# Patient Record
Sex: Male | Born: 1940 | Race: Black or African American | Hispanic: No | Smoking: Former smoker
Health system: Southern US, Community
[De-identification: ages and names within clinical notes are randomized; demographics above are authoritative.]

## PROBLEM LIST (undated history)

## (undated) DIAGNOSIS — I10 Essential (primary) hypertension: Secondary | ICD-10-CM

## (undated) DIAGNOSIS — E119 Type 2 diabetes mellitus without complications: Secondary | ICD-10-CM

## (undated) DIAGNOSIS — C61 Malignant neoplasm of prostate: Secondary | ICD-10-CM

## (undated) HISTORY — PX: PROSTATE SURGERY: SHX751

---

## 2004-02-10 ENCOUNTER — Emergency Department: Payer: Self-pay | Admitting: General Practice

## 2006-01-31 ENCOUNTER — Other Ambulatory Visit: Payer: Self-pay

## 2006-01-31 ENCOUNTER — Emergency Department: Payer: Self-pay | Admitting: Emergency Medicine

## 2007-03-26 ENCOUNTER — Emergency Department: Payer: Self-pay | Admitting: Emergency Medicine

## 2007-09-04 ENCOUNTER — Emergency Department: Payer: Self-pay | Admitting: Emergency Medicine

## 2008-02-09 ENCOUNTER — Emergency Department: Payer: Self-pay | Admitting: Emergency Medicine

## 2008-12-02 ENCOUNTER — Emergency Department: Payer: Self-pay | Admitting: Internal Medicine

## 2009-02-16 ENCOUNTER — Emergency Department: Payer: Self-pay | Admitting: Internal Medicine

## 2009-03-05 ENCOUNTER — Encounter: Payer: Self-pay | Admitting: Internal Medicine

## 2009-03-21 ENCOUNTER — Encounter: Payer: Self-pay | Admitting: Internal Medicine

## 2009-04-21 ENCOUNTER — Encounter: Payer: Self-pay | Admitting: Internal Medicine

## 2015-07-24 ENCOUNTER — Encounter: Payer: Self-pay | Admitting: Emergency Medicine

## 2015-07-24 ENCOUNTER — Emergency Department
Admission: EM | Admit: 2015-07-24 | Discharge: 2015-07-24 | Disposition: A | Discharge status: Home / Self Care | Payer: No Typology Code available for payment source | Attending: Emergency Medicine | Admitting: Emergency Medicine

## 2015-07-24 ENCOUNTER — Emergency Department: Payer: No Typology Code available for payment source

## 2015-07-24 DIAGNOSIS — Y939 Activity, unspecified: Secondary | ICD-10-CM | POA: Insufficient documentation

## 2015-07-24 DIAGNOSIS — Y929 Unspecified place or not applicable: Secondary | ICD-10-CM | POA: Insufficient documentation

## 2015-07-24 DIAGNOSIS — Y999 Unspecified external cause status: Secondary | ICD-10-CM | POA: Insufficient documentation

## 2015-07-24 DIAGNOSIS — E119 Type 2 diabetes mellitus without complications: Secondary | ICD-10-CM | POA: Insufficient documentation

## 2015-07-24 DIAGNOSIS — I10 Essential (primary) hypertension: Secondary | ICD-10-CM | POA: Diagnosis not present

## 2015-07-24 DIAGNOSIS — F17200 Nicotine dependence, unspecified, uncomplicated: Secondary | ICD-10-CM | POA: Insufficient documentation

## 2015-07-24 DIAGNOSIS — S161XXA Strain of muscle, fascia and tendon at neck level, initial encounter: Secondary | ICD-10-CM | POA: Diagnosis not present

## 2015-07-24 DIAGNOSIS — S199XXA Unspecified injury of neck, initial encounter: Secondary | ICD-10-CM | POA: Diagnosis present

## 2015-07-24 HISTORY — DX: Type 2 diabetes mellitus without complications: E11.9

## 2015-07-24 HISTORY — DX: Essential (primary) hypertension: I10

## 2015-07-24 MED ORDER — TRAMADOL HCL 50 MG PO TABS: 50.0000 mg | ORAL_TABLET | Freq: Two times a day (BID) | ORAL | Status: DC | PRN
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
## 2015-07-24 NOTE — Discharge Instructions (Signed)

## 2015-12-14 ENCOUNTER — Emergency Department: Payer: Medicare HMO

## 2015-12-14 ENCOUNTER — Encounter: Payer: Self-pay | Admitting: Emergency Medicine

## 2015-12-14 ENCOUNTER — Emergency Department
Admission: EM | Admit: 2015-12-14 | Discharge: 2015-12-14 | Disposition: A | Discharge status: Home / Self Care | Payer: Medicare HMO | Attending: Emergency Medicine | Admitting: Emergency Medicine

## 2015-12-14 DIAGNOSIS — E119 Type 2 diabetes mellitus without complications: Secondary | ICD-10-CM | POA: Insufficient documentation

## 2015-12-14 DIAGNOSIS — M79605 Pain in left leg: Secondary | ICD-10-CM | POA: Diagnosis present

## 2015-12-14 DIAGNOSIS — Z8546 Personal history of malignant neoplasm of prostate: Secondary | ICD-10-CM | POA: Diagnosis not present

## 2015-12-14 DIAGNOSIS — I82412 Acute embolism and thrombosis of left femoral vein: Secondary | ICD-10-CM | POA: Diagnosis not present

## 2015-12-14 DIAGNOSIS — Z794 Long term (current) use of insulin: Secondary | ICD-10-CM | POA: Insufficient documentation

## 2015-12-14 DIAGNOSIS — F17210 Nicotine dependence, cigarettes, uncomplicated: Secondary | ICD-10-CM | POA: Insufficient documentation

## 2015-12-14 DIAGNOSIS — O2230 Deep phlebothrombosis in pregnancy, unspecified trimester: Secondary | ICD-10-CM

## 2015-12-14 DIAGNOSIS — Z791 Long term (current) use of non-steroidal anti-inflammatories (NSAID): Secondary | ICD-10-CM | POA: Diagnosis not present

## 2015-12-14 DIAGNOSIS — I10 Essential (primary) hypertension: Secondary | ICD-10-CM | POA: Insufficient documentation

## 2015-12-14 HISTORY — DX: Malignant neoplasm of prostate: C61

## 2015-12-14 LAB — CBC
HCT: 38.1 % — ABNORMAL LOW (ref 40.0–52.0)
HEMOGLOBIN: 13.5 g/dL (ref 13.0–18.0)
MCH: 29.4 pg (ref 26.0–34.0)
MCHC: 35.5 g/dL (ref 32.0–36.0)
MCV: 82.9 fL (ref 80.0–100.0)
Platelets: 242 10*3/uL (ref 150–440)
RBC: 4.59 MIL/uL (ref 4.40–5.90)
RDW: 14.8 % — AB (ref 11.5–14.5)
WBC: 6.5 10*3/uL (ref 3.8–10.6)

## 2015-12-14 LAB — BASIC METABOLIC PANEL
ANION GAP: 7 (ref 5–15)
BUN: 24 mg/dL — AB (ref 6–20)
CALCIUM: 8.8 mg/dL — AB (ref 8.9–10.3)
CO2: 23 mmol/L (ref 22–32)
Chloride: 108 mmol/L (ref 101–111)
Creatinine, Ser: 1.22 mg/dL (ref 0.61–1.24)
GFR calc Af Amer: >60 mL/min (ref >60)
GFR, EST NON AFRICAN AMERICAN: 57 mL/min — AB (ref >60)
GLUCOSE: 189 mg/dL — AB (ref 65–99)
Potassium: 3.7 mmol/L (ref 3.5–5.1)
Sodium: 138 mmol/L (ref 135–145)

## 2015-12-14 MED ORDER — RIVAROXABAN (XARELTO) VTE STARTER PACK (15 & 20 MG): ORAL_TABLET | ORAL | 0 refills | Status: DC

## 2015-12-14 NOTE — ED Notes (Signed)
Patient states that he has been having swelling in his left leg that started this week. Last week patient noted that he was having swelling in his ankle but it would go down at night Patient also states that he is having pain in his calf.  Patient denies recent surgery or travel, patient has not had any injury to the area. Patient denies chest pain or shortness of breath.

## 2015-12-14 NOTE — ED Provider Notes (Signed)
[facility] Emergency Department Provider Note    ____________________________________________   I have reviewed the triage vital signs and the nursing notes.   HISTORY  Chief Complaint Leg Pain   History limited by: Not Limited   HPI Ricky Brennan is a 75 y.o. male who presents to the emergency department today because of concerns for left leg pain and swelling. The patient started having swelling 1 week ago. It started in his ankle and started coming up his leg. He started developing pain a couple of days ago. He describes it as a soreness. It is present when he walks but states he is able to walk.He denies any chest pain or shortness breath. Denies any recent travel. No history of blood clots. No fevers.    Past Medical History:  Diagnosis Date  . Diabetes mellitus without complication ([place])   . Hypertension   . Prostate cancer ([place])     There are no active problems to display for this patient.   History reviewed. No pertinent surgical history.  Prior to Admission medications   Medication Sig Start Date End Date Taking? Authorizing Provider  enalapril (VASOTEC) 10 MG tablet Take 10 mg by mouth daily.    Historical Provider, MD  insulin aspart protamine- aspart (NOVOLOG MIX 70/30) (70-30) 100 UNIT/ML injection Inject 30 Units into the skin 2 (two) times daily with a meal.    Historical Provider, MD  traMADol (ULTRAM) 50 MG tablet Take 1 tablet (50 mg total) by mouth every 12 (twelve) hours as needed for moderate pain. 07/24/15   Sable Feil, PA-C    Allergies Review of patient's allergies indicates no known allergies.  History reviewed. No pertinent family history.  Social History Social History  Substance Use Topics  . Smoking status: Current Some Day Smoker    Types: Cigars  . Smokeless tobacco: Never Used  . Alcohol use Yes    Review of Systems  Constitutional: Negative for fever. Cardiovascular: Negative for chest  pain. Respiratory: Negative for shortness of breath. Gastrointestinal: Negative for abdominal pain, vomiting and diarrhea. Genitourinary: Negative for dysuria. Musculoskeletal: Negative for back pain. Positive for left leg. Skin: Negative for rash. Neurological: Negative for headaches, focal weakness or numbness.   10-point ROS otherwise negative.  ____________________________________________   PHYSICAL EXAM:  VITAL SIGNS: ED Triage Vitals [12/14/15 0933]  Enc Vitals Group     BP (!) 152/82     Pulse Rate 74     Resp 20     Temp 98 F (36.7 C)     Temp Source Oral     SpO2 96 %     Weight 215 lb (97.5 kg)     Height 5\' 11"  (1.803 m)     Head Circumference      Peak Flow      Pain Score 5   Constitutional: Alert and oriented. Well appearing and in no distress. Eyes: Conjunctivae are normal. PERRL. Normal extraocular movements. ENT   Head: Normocephalic and atraumatic.   Nose: No congestion/rhinnorhea.   Mouth/Throat: Mucous membranes are moist.   Neck: No stridor. Hematological/Lymphatic/Immunilogical: No cervical lymphadenopathy. Cardiovascular: Normal rate, regular rhythm.  No murmurs, rubs, or gallops. Respiratory: Normal respiratory effort without tachypnea nor retractions. Breath sounds are clear and equal bilaterally. No wheezes/rales/rhonchi. Gastrointestinal: Soft and nontender. No distention. Genitourinary: Deferred Musculoskeletal: Left leg swelling. Dorsalis pedis pulse 2+. Neurologic:  Normal speech and language. No gross focal neurologic deficits are appreciated.  Skin:  Skin is warm,  dry and intact. No rash noted. Psychiatric: Mood and affect are normal. Speech and behavior are normal. Patient exhibits appropriate insight and judgment.  ____________________________________________    LABS (pertinent positives/negatives)  Labs Reviewed  BASIC METABOLIC PANEL - Abnormal; Notable for the following:       Result Value   Glucose, Bld 189  (*)    BUN 24 (*)    Calcium 8.8 (*)    GFR calc non Af Amer 57 (*)    All other components within normal limits  CBC - Abnormal; Notable for the following:    HCT 38.1 (*)    RDW 14.8 (*)    All other components within normal limits     ____________________________________________   EKG  None  ____________________________________________    RADIOLOGY  US venous IMPRESSION:  Sonographic survey of the left lower extremity is positive for DVT  with occlusive thrombus of the left femoral vein, extending distally  through the popliteal vein and into the tibial veins.      ____________________________________________   PROCEDURES  Procedures  ____________________________________________   INITIAL IMPRESSION / ASSESSMENT AND PLAN / ED COURSE  Pertinent labs & imaging results that were available during my care of the patient were reviewed by me and considered in my medical decision making (see chart for details).  Patient presents with left leg swelling and pain. Ultrasound is consistent with DVT. Will start patient on Xarelto. 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 Any transcriptional errors that result from this process are unintentional    Nance Pear, MD 12/14/15 1245

## 2015-12-14 NOTE — Discharge Instructions (Signed)
Please seek medical attention for any high fevers, chest pain, shortness of breath, change in behavior, persistent vomiting, bloody stool or any other new or concerning symptoms.  

## 2015-12-14 NOTE — ED Triage Notes (Signed)
Patient presents to the ED with swelling to his left leg.  Patient is in no obvious distress at this time.  Patient reports history of hypertension.  Patient denies pain but reports some pitting edema.  Denies swelling to right leg.  Patient ambulatory to triage with no obvious distress at this time.

## 2015-12-14 NOTE — ED Triage Notes (Signed)
Pt denies any chest pain or shortness of breath.

## 2015-12-14 NOTE — ED Triage Notes (Signed)
Pt presents to ED via POV with son for c/o left leg swelling and calf pain.  Pt states last week he noticed his leg swelling but went away overnight, but has returned.  Pt states he noticed ankle pain to his ankle which moved up into his calf. Calf pain started about 2 days ago.  Right leg does not have any swelling.  3+ edema on left leg.  Pain worse with ambulation.

## 2016-04-26 ENCOUNTER — Encounter: Payer: Self-pay | Admitting: Emergency Medicine

## 2016-04-26 ENCOUNTER — Emergency Department: Payer: Medicare HMO

## 2016-04-26 ENCOUNTER — Emergency Department
Admission: EM | Admit: 2016-04-26 | Discharge: 2016-04-26 | Disposition: A | Discharge status: Home / Self Care | Payer: Medicare HMO | Attending: Emergency Medicine | Admitting: Emergency Medicine

## 2016-04-26 DIAGNOSIS — E119 Type 2 diabetes mellitus without complications: Secondary | ICD-10-CM | POA: Diagnosis not present

## 2016-04-26 DIAGNOSIS — J069 Acute upper respiratory infection, unspecified: Secondary | ICD-10-CM | POA: Diagnosis not present

## 2016-04-26 DIAGNOSIS — I10 Essential (primary) hypertension: Secondary | ICD-10-CM | POA: Insufficient documentation

## 2016-04-26 DIAGNOSIS — F17290 Nicotine dependence, other tobacco product, uncomplicated: Secondary | ICD-10-CM | POA: Insufficient documentation

## 2016-04-26 DIAGNOSIS — Z8546 Personal history of malignant neoplasm of prostate: Secondary | ICD-10-CM | POA: Insufficient documentation

## 2016-04-26 DIAGNOSIS — Z79899 Other long term (current) drug therapy: Secondary | ICD-10-CM | POA: Diagnosis not present

## 2016-04-26 DIAGNOSIS — R05 Cough: Secondary | ICD-10-CM | POA: Diagnosis present

## 2016-04-26 DIAGNOSIS — Z794 Long term (current) use of insulin: Secondary | ICD-10-CM | POA: Insufficient documentation

## 2016-04-26 MED ORDER — AZITHROMYCIN 250 MG PO TABS: ORAL_TABLET | ORAL | 0 refills | Status: AC

## 2016-04-26 MED ORDER — PSEUDOEPH-BROMPHEN-DM 30-2-10 MG/5ML PO SYRP: 5.0000 mL | ORAL_SOLUTION | Freq: Four times a day (QID) | ORAL | 0 refills | Status: DC | PRN

## 2016-04-26 MED ORDER — HYDROCOD POLST-CPM POLST ER 10-8 MG/5ML PO SUER
5.0000 mL | Freq: Once | ORAL | Status: AC
  Administered 2016-04-26: 5 mL via ORAL
  Filled 2016-04-26: qty 5

## 2016-04-26 NOTE — ED Triage Notes (Signed)
Cough x 4 days

## 2016-04-26 NOTE — ED Provider Notes (Signed)
[facility] Emergency Department Provider Note   ____________________________________________   First MD Initiated Contact with Patient 04/26/16 1116     (approximate)  I have reviewed the triage vital signs and the nursing notes.   HISTORY  Chief Complaint Cough    HPI Ricky Brennan is a 76 y.o. male patient complaining of productive greenish cough 4 days. Patient denies any fever associated this complaint. Patient denies nausea, vomiting, or diarrhea. Patient is taking flu shot this season. Patient denies any pain with this complaint.   Past Medical History:  Diagnosis Date  . Diabetes mellitus without complication ([place])   . Hypertension   . Prostate cancer ([place])     There are no active problems to display for this patient.   History reviewed. No pertinent surgical history.  Prior to Admission medications   Medication Sig Start Date End Date Taking? Authorizing Provider  azithromycin (ZITHROMAX Z-PAK) 250 MG tablet Take 2 tablets (500 mg) on  Day 1,  followed by 1 tablet (250 mg) once daily on Days 2 through 5. 04/26/16 05/01/16  Sable Feil, PA-C  brompheniramine-pseudoephedrine-DM 30-2-10 MG/5ML syrup Take 5 mLs by mouth 4 (four) times daily as needed. 04/26/16   Sable Feil, PA-C  enalapril (VASOTEC) 10 MG tablet Take 10 mg by mouth daily.    Historical Provider, MD  insulin aspart protamine- aspart (NOVOLOG MIX 70/30) (70-30) 100 UNIT/ML injection Inject 30 Units into the skin 2 (two) times daily with a meal.    Historical Provider, MD  Rivaroxaban (XARELTO STARTER PACK) 15 & 20 MG TBPK Take as directed on package: Start with one 15mg  tablet by mouth twice a day with food. On Day 22, switch to one 20mg  tablet once a day with food. 12/14/15   Nance Pear, MD  traMADol (ULTRAM) 50 MG tablet Take 1 tablet (50 mg total) by mouth every 12 (twelve) hours as needed for moderate pain. 07/24/15   Sable Feil, PA-C    Allergies Patient has  no known allergies.  No family history on file.  Social History Social History  Substance Use Topics  . Smoking status: Current Some Day Smoker    Types: Cigars  . Smokeless tobacco: Never Used  . Alcohol use Yes    Review of Systems Constitutional: No fever/chills Eyes: No visual changes. ENT: No sore throat. Cardiovascular: Denies chest pain. Respiratory: Denies shortness of breath. Dr. cough Gastrointestinal: No abdominal pain.  No nausea, no vomiting.  No diarrhea.  No constipation. Genitourinary: Negative for dysuria. Musculoskeletal: Negative for back pain. Skin: Negative for rash. Neurological: Negative for headaches, focal weakness or numbness. Endocrine:Hypertension and diabetes   ____________________________________________   PHYSICAL EXAM:  VITAL SIGNS: ED Triage Vitals  Enc Vitals Group     BP 04/26/16 1020 (!) 160/67     Pulse Rate 04/26/16 1020 92     Resp 04/26/16 1020 20     Temp 04/26/16 1020 98.2 F (36.8 C)     Temp Source 04/26/16 1020 Oral     SpO2 04/26/16 1020 95 %     Weight 04/26/16 1020 220 lb (99.8 kg)     Height 04/26/16 1020 5\' 11"  (1.803 m)     Head Circumference --      Peak Flow --      Pain Score 04/26/16 1113 0     Pain Loc --      Pain Edu? --      Excl. in [place]? --  Constitutional: Alert and oriented. Well appearing and in no acute distress. Eyes: Conjunctivae are normal. PERRL. EOMI. Head: Atraumatic. Nose: No congestion/rhinnorhea. Mouth/Throat: Mucous membranes are moist.  Oropharynx non-erythematous. Neck: No stridor.  No cervical spine tenderness to palpation. Hematological/Lymphatic/Immunilogical: No cervical lymphadenopathy. Cardiovascular: Normal rate, regular rhythm. Grossly normal heart sounds.  Good peripheral circulation. Respiratory: Normal respiratory effort.  No retractions. Lungs CTAB. Gastrointestinal: Soft and nontender. No distention. No abdominal bruits. No CVA tenderness. Musculoskeletal: No lower  extremity tenderness nor edema.  No joint effusions. Neurologic:  Normal speech and language. No gross focal neurologic deficits are appreciated. No gait instability. Skin:  Skin is warm, dry and intact. No rash noted. Psychiatric: Mood and affect are normal. Speech and behavior are normal.  ____________________________________________   LABS (all labs ordered are listed, but only abnormal results are displayed)  Labs Reviewed - No data to display ____________________________________________  EKG   ____________________________________________  RADIOLOGY  No acute findings on x-ray of chest. ____________________________________________   PROCEDURES  Procedure(s) performed: None  Procedures  Critical Care performed: No  ____________________________________________   INITIAL IMPRESSION / ASSESSMENT AND PLAN / ED COURSE  Pertinent labs & imaging results that were available during my care of the patient were reviewed by me and considered in my medical decision making (see chart for details).  Bronchitis. Patient given discharge care instructions. Patient given a prescription for Zithromax and Bromfed DM. Patient advised to follow-up with family doctor condition persists. Clinical Course      ____________________________________________   FINAL CLINICAL IMPRESSION(S) / ED DIAGNOSES  Final diagnoses:  Acute upper respiratory infection      NEW MEDICATIONS STARTED DURING THIS VISIT:  Discharge Medication List as of 04/26/2016 11:20 AM    START taking these medications   Details  azithromycin (ZITHROMAX Z-PAK) 250 MG tablet Take 2 tablets (500 mg) on  Day 1,  followed by 1 tablet (250 mg) once daily on Days 2 through 5., Print    brompheniramine-pseudoephedrine-DM 30-2-10 MG/5ML syrup Take 5 mLs by mouth 4 (four) times daily as needed., Starting Sat 04/26/2016, Print         Note:  This document was prepared using Dragon voice recognition software and may  include unintentional dictation errors.    Sable Feil, PA-C 04/26/16 [place], MD 04/26/16 1210

## 2017-01-09 ENCOUNTER — Encounter: Payer: Self-pay | Admitting: Emergency Medicine

## 2017-01-09 ENCOUNTER — Emergency Department
Admission: EM | Admit: 2017-01-09 | Discharge: 2017-01-09 | Disposition: A | Discharge status: Home / Self Care | Payer: Medicare HMO | Attending: Emergency Medicine | Admitting: Emergency Medicine

## 2017-01-09 DIAGNOSIS — F17290 Nicotine dependence, other tobacco product, uncomplicated: Secondary | ICD-10-CM | POA: Diagnosis not present

## 2017-01-09 DIAGNOSIS — B029 Zoster without complications: Secondary | ICD-10-CM | POA: Diagnosis not present

## 2017-01-09 DIAGNOSIS — R21 Rash and other nonspecific skin eruption: Secondary | ICD-10-CM | POA: Diagnosis present

## 2017-01-09 DIAGNOSIS — I10 Essential (primary) hypertension: Secondary | ICD-10-CM | POA: Insufficient documentation

## 2017-01-09 DIAGNOSIS — E119 Type 2 diabetes mellitus without complications: Secondary | ICD-10-CM | POA: Insufficient documentation

## 2017-01-09 DIAGNOSIS — Z79899 Other long term (current) drug therapy: Secondary | ICD-10-CM | POA: Insufficient documentation

## 2017-01-09 MED ORDER — VALACYCLOVIR HCL 500 MG PO TABS
1000.0000 mg | ORAL_TABLET | ORAL | Status: AC
  Administered 2017-01-09: 1000 mg via ORAL
  Filled 2017-01-09: qty 2

## 2017-01-09 MED ORDER — VALACYCLOVIR HCL 1 G PO TABS: 1000.0000 mg | ORAL_TABLET | Freq: Three times a day (TID) | ORAL | 0 refills | Status: AC

## 2017-01-09 NOTE — ED Provider Notes (Signed)
[facility] Emergency Department Provider Note  ____________________________________________  Time seen: Approximately 1:12 PM  I have reviewed the triage vital signs and the nursing notes.   HISTORY  Chief Complaint Herpes Zoster    HPI Ricky Brennan is a 76 y.o. male who complains of pain to the right upper abdomen wrapping around to his back, started 3 days ago, associated with a blistering rash in the area. Denies fever chills no neck pain or stiffness or headaches. No dizziness or syncope. No eye pain. No vision changes. He is diabetic, takes his insulin reliably. Fingerstick this morning was 1:30.Denies HIV, chemotherapy, or any other immunosuppression.  Pain is sharp and burning, moderate intensity, no aggravating or alleviating factors. Patient has had chickenpox as a child. He is not around any small children. His youngest grandchild's 18yrs.   Past Medical History:  Diagnosis Date  . Diabetes mellitus without complication ([place])   . Hypertension   . Prostate cancer ([place])      There are no active problems to display for this patient.    History reviewed. No pertinent surgical history.   Prior to Admission medications   Medication Sig Start Date End Date Taking? Authorizing Provider  brompheniramine-pseudoephedrine-DM 30-2-10 MG/5ML syrup Take 5 mLs by mouth 4 (four) times daily as needed. 04/26/16   Sable Feil, PA-C  enalapril (VASOTEC) 10 MG tablet Take 10 mg by mouth daily.    [provider]  insulin aspart protamine- aspart (NOVOLOG MIX 70/30) (70-30) 100 UNIT/ML injection Inject 30 Units into the skin 2 (two) times daily with a meal.    [provider]  Rivaroxaban (XARELTO STARTER PACK) 15 & 20 MG TBPK Take as directed on package: Start with one 15mg  tablet by mouth twice a day with food. On Day 22, switch to one 20mg  tablet once a day with food. 12/14/15   Nance Pear, MD  traMADol (ULTRAM) 50 MG tablet Take  1 tablet (50 mg total) by mouth every 12 (twelve) hours as needed for moderate pain. 07/24/15   Sable Feil, PA-C  valACYclovir (VALTREX) 1000 MG tablet Take 1 tablet (1,000 mg total) by mouth 3 (three) times daily. 01/09/17 01/16/17  Carrie Mew, MD     Allergies Patient has no known allergies.   No family history on file.  Social History Social History  Substance Use Topics  . Smoking status: Current Some Day Smoker    Types: Cigars  . Smokeless tobacco: Never Used  . Alcohol use Yes    Review of Systems  Constitutional:   No fever or chills.  ENT:   No sore throat. No rhinorrhea. Cardiovascular:   No chest pain or syncope. Respiratory:   No dyspnea or cough. Gastrointestinal:   Positive as above for abdominal pain, no vomiting or diarrhea  Musculoskeletal:   Negative for focal pain or swelling All other systems reviewed and are negative except as documented above in ROS and HPI.  ____________________________________________   PHYSICAL EXAM:  VITAL SIGNS: ED Triage Vitals  Enc Vitals Group     BP --      Pulse Rate 01/09/17 1233 99     Resp 01/09/17 1233 16     Temp 01/09/17 1233 98.4 F (36.9 C)     Temp Source 01/09/17 1233 Oral     SpO2 01/09/17 1233 100 %     Weight 01/09/17 1234 218 lb (98.9 kg)     Height 01/09/17 1234 5\' 11"  (1.803 m)  Head Circumference --      Peak Flow --      Pain Score 01/09/17 1233 10     Pain Loc --      Pain Edu? --      Excl. in [place]? --     Vital signs reviewed, nursing assessments reviewed.   Constitutional:   Alert and oriented. Well appearing and in no distress. Eyes:   No scleral icterus.  EOMI. No nystagmus. No conjunctival pallor. PERRL. ENT   Head:   Normocephalic and atraumatic.   Nose:   No congestion/rhinnorhea.    Mouth/Throat:   MMM, no pharyngeal erythema. No peritonsillar mass.    Neck:   No meningismus. Full ROM Hematological/Lymphatic/Immunilogical:   No cervical  lymphadenopathy. Cardiovascular:   RRR. Symmetric bilateral radial and DP pulses.  No murmurs.  Respiratory:   Normal respiratory effort without tachypnea/retractions. Breath sounds are clear and equal bilaterally. No wheezes/rales/rhonchi. Gastrointestinal:   Soft and nontender. Non distended. There is no CVA tenderness.  No rebound, rigidity, or guarding. Genitourinary:   deferred Musculoskeletal:   Normal range of motion in all extremities. No joint effusions.  No lower extremity tenderness.  No edema. Neurologic:   Normal speech and language.  Motor grossly intact. No gross focal neurologic deficits are appreciated.  Skin:    Skin is warm With crops of erythema and blistering rash in the right T9 distribution..  ____________________________________________    LABS (pertinent positives/negatives) (all labs ordered are listed, but only abnormal results are displayed) Labs Reviewed - No data to display ____________________________________________   EKG    ____________________________________________    RADIOLOGY  No results found.  ____________________________________________   PROCEDURES Procedures  ____________________________________________   INITIAL IMPRESSION / ASSESSMENT AND PLAN / ED COURSE  Pertinent labs & imaging results that were available during my care of the patient were reviewed by me and considered in my medical decision making (see chart for details).  Patient presents with blistering rash on the abdomen consistent with shingles. It is not consistent with Stevens-Johnson syndrome or vasculitis. Not an allergic rash in. Patient given Valtrex in the ED, we'll continue him on this for a week. Counseled him about postherpetic neuralgia as well as avoiding exposure to any small children, pregnant people, or anyone else to hasn't previously been exposed or vaccinated to varicella.      ____________________________________________   FINAL CLINICAL  IMPRESSION(S) / ED DIAGNOSES  Final diagnoses:  Herpes zoster without complication      New Prescriptions   VALACYCLOVIR (VALTREX) 1000 MG TABLET    Take 1 tablet (1,000 mg total) by mouth 3 (three) times daily.     Portions of this note were generated with dragon dictation software. Dictation errors may occur despite best attempts at proofreading.    Carrie Mew, MD 01/09/17 1318

## 2017-01-09 NOTE — ED Triage Notes (Signed)
Patient presents to ED via POV from home with painful rash noted to abdomen and back x 3-4 days. Patient also reports muscle spasms.

## 2017-08-04 ENCOUNTER — Ambulatory Visit
Admission: RE | Admit: 2017-08-04 | Discharge: 2017-08-04 | Disposition: A | Discharge status: Home / Self Care | Payer: Medicare HMO | Source: Ambulatory Visit | Attending: Nurse Practitioner | Admitting: Nurse Practitioner

## 2017-08-04 ENCOUNTER — Telehealth: Payer: Self-pay | Admitting: *Deleted

## 2017-08-04 ENCOUNTER — Inpatient Hospital Stay: Payer: Medicare HMO | Attending: Nurse Practitioner | Admitting: Nurse Practitioner

## 2017-08-04 DIAGNOSIS — J432 Centrilobular emphysema: Secondary | ICD-10-CM | POA: Insufficient documentation

## 2017-08-04 DIAGNOSIS — I700 Atherosclerosis of aorta: Secondary | ICD-10-CM | POA: Insufficient documentation

## 2017-08-04 DIAGNOSIS — J438 Other emphysema: Secondary | ICD-10-CM | POA: Diagnosis not present

## 2017-08-04 DIAGNOSIS — Z122 Encounter for screening for malignant neoplasm of respiratory organs: Secondary | ICD-10-CM

## 2017-08-04 DIAGNOSIS — Z87891 Personal history of nicotine dependence: Secondary | ICD-10-CM

## 2017-08-04 DIAGNOSIS — R918 Other nonspecific abnormal finding of lung field: Secondary | ICD-10-CM | POA: Insufficient documentation

## 2017-08-04 NOTE — Progress Notes (Signed)
In accordance with CMS guidelines, patient has met eligibility criteria including age, absence of signs or symptoms of lung cancer.  Social History   Tobacco Use  . Smoking status: Current Some Day Smoker    Types: Cigars  . Smokeless tobacco: Never Used  Substance Use Topics  . Alcohol use: Yes  . Drug use: Not on file      A shared decision-making session was conducted prior to the performance of CT scan. This includes one or more decision aids, includes benefits and harms of screening, follow-up diagnostic testing, over-diagnosis, false positive rate, and total radiation exposure.   Counseling on the importance of adherence to annual lung cancer LDCT screening, impact of co-morbidities, and ability or willingness to undergo diagnosis and treatment is imperative for compliance of the program.   Counseling on the importance of continued smoking cessation for former smokers; the importance of smoking cessation for current smokers, and information about tobacco cessation interventions have been given to patient including [place] and 1800 quit [place] programs.   Written order for lung cancer screening with LDCT has been given to the patient and any and all questions have been answered to the best of my abilities.    Yearly follow up will be coordinated by Burgess Estelle, Thoracic Navigator.  Beckey Rutter, DNP, AGNP-C [place] at [facility] [phone] (work cell) [phone] (office) 08/04/17 11:38 AM

## 2017-08-04 NOTE — Telephone Encounter (Signed)
Received referral for initial lung cancer screening scan. Contacted patient and obtained smoking history,(former, quit 2005, 135 pack year) as well as answering questions related to screening process. Patient denies signs of lung cancer such as weight loss or hemoptysis. Patient denies comorbidity that would prevent curative treatment if lung cancer were found. Patient is scheduled for shared decision making visit and CT scan on 08/04/17.

## 2017-08-07 ENCOUNTER — Telehealth: Payer: Self-pay | Admitting: *Deleted

## 2017-08-07 NOTE — Telephone Encounter (Signed)
Contacted patient and reviewed lung screening results including the incidental findings noted below. Patient verbalizes importance of follow up for infection as well as having CT scan in near future to further evaluate the findings. Patient will contact PCP if his office does not contact patient in short period of time. Results will be sent to Dr. Eustace Quail at [facility]. Patient knows that we can make appropriate referrals to pulmonary if needed.  IMPRESSION: 1. Lung-RADS 4A, suspicious. This is based on the presence of multiple areas of nodular architectural distortion in the posterior aspect of the right upper lobe. These are strongly favored to be infectious or inflammatory in etiology but definitely warrant close attention on future follow-up examinations. Importantly, given the widespread micronodularity throughout the lungs bilaterally, and high likelihood of future false positivity on lung cancer screening examinations, this patient is not considered a suitable candidate for future lung cancer screening examinations, but does need routine imaging follow-up with noncontrast chest CT in 3 months to re-evaluate these nodules to ensure stability or resolution. 2. 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 Outpatient referral to Pulmonology for further evaluation and clinical follow-up is recommended. 3. Aortic atherosclerosis. 4. Mild diffuse bronchial wall thickening with mild centrilobular and paraseptal emphysema; imaging findings suggestive of underlying COPD.  Aortic Atherosclerosis (ICD10-I70.0) and Emphysema (ICD10-J43.9).

## 2017-10-28 ENCOUNTER — Telehealth: Payer: Self-pay | Admitting: *Deleted

## 2017-10-28 DIAGNOSIS — R918 Other nonspecific abnormal finding of lung field: Secondary | ICD-10-CM

## 2017-10-28 NOTE — Telephone Encounter (Signed)
Contacted patient regarding lung nodule follow up that was recommended from lung screening scan. This has not been scheduled at this time. Patient is agreeable to scheduling on 11/05/17 with [place] providing transportation.

## 2017-11-05 ENCOUNTER — Ambulatory Visit
Admission: RE | Admit: 2017-11-05 | Discharge: 2017-11-05 | Disposition: A | Discharge status: Home / Self Care | Payer: Medicare HMO | Source: Ambulatory Visit | Attending: Oncology | Admitting: Oncology

## 2017-11-05 DIAGNOSIS — R918 Other nonspecific abnormal finding of lung field: Secondary | ICD-10-CM | POA: Insufficient documentation

## 2017-11-05 DIAGNOSIS — J479 Bronchiectasis, uncomplicated: Secondary | ICD-10-CM | POA: Insufficient documentation

## 2017-11-05 DIAGNOSIS — I700 Atherosclerosis of aorta: Secondary | ICD-10-CM | POA: Diagnosis not present

## 2017-11-05 DIAGNOSIS — J439 Emphysema, unspecified: Secondary | ICD-10-CM | POA: Diagnosis not present

## 2017-11-12 ENCOUNTER — Telehealth: Payer: Self-pay | Admitting: *Deleted

## 2017-11-12 NOTE — Telephone Encounter (Signed)
Contacted patient and reviewed CT chest results and recommendation for 12 month follow up with lung screening program. Encouraged to discuss findings with PCP at next visit.   IMPRESSION: 1. Interval resolution of previous right upper lobe pulmonary nodules compatible with an inflammatory or infectious process. Recommend resuming Lung cancer screening exam with repeat study in 12 months using the CT chest LCS exam. 2. 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 3. Stable appearance of soft tissue attenuating nodule in the anterior mediastinum. The appearance is nonspecific. If there are signs or symptoms of myasthenia gravis advised referral to thoracic surgery. Otherwise, if the patient is asymptomatic attention on follow-up imaging is advised. 4.  Emphysema (ICD10-J43.9). 5.  Aortic Atherosclerosis (ICD10-I70.0

## 2018-02-15 ENCOUNTER — Emergency Department: Payer: Medicare HMO

## 2018-02-15 ENCOUNTER — Other Ambulatory Visit: Payer: Self-pay

## 2018-02-15 ENCOUNTER — Emergency Department
Admission: EM | Admit: 2018-02-15 | Discharge: 2018-02-15 | Disposition: A | Discharge status: Home / Self Care | Payer: Medicare HMO | Attending: Emergency Medicine | Admitting: Emergency Medicine

## 2018-02-15 DIAGNOSIS — Z79899 Other long term (current) drug therapy: Secondary | ICD-10-CM | POA: Diagnosis not present

## 2018-02-15 DIAGNOSIS — E119 Type 2 diabetes mellitus without complications: Secondary | ICD-10-CM | POA: Insufficient documentation

## 2018-02-15 DIAGNOSIS — B070 Plantar wart: Secondary | ICD-10-CM | POA: Insufficient documentation

## 2018-02-15 DIAGNOSIS — Z8546 Personal history of malignant neoplasm of prostate: Secondary | ICD-10-CM | POA: Diagnosis not present

## 2018-02-15 DIAGNOSIS — Z794 Long term (current) use of insulin: Secondary | ICD-10-CM | POA: Diagnosis not present

## 2018-02-15 DIAGNOSIS — I10 Essential (primary) hypertension: Secondary | ICD-10-CM | POA: Insufficient documentation

## 2018-02-15 DIAGNOSIS — F17210 Nicotine dependence, cigarettes, uncomplicated: Secondary | ICD-10-CM | POA: Insufficient documentation

## 2018-02-15 DIAGNOSIS — Z7901 Long term (current) use of anticoagulants: Secondary | ICD-10-CM | POA: Insufficient documentation

## 2018-02-15 DIAGNOSIS — M79672 Pain in left foot: Secondary | ICD-10-CM | POA: Diagnosis present

## 2018-02-15 NOTE — Discharge Instructions (Signed)
Follow-up with Dr. Elvina Mattes who is the podiatrist on call for your plantars wart. You may also obtain the medicated foot pads soled at most pharmacies, [facility] and [facility] in the foot care aisle.  These little medicated pads should make a layer of the wart fall off.  If you are not having success with this then you will need to see the podiatrist.

## 2018-02-15 NOTE — ED Provider Notes (Signed)
[facility] Emergency Department Provider Note   ____________________________________________   First MD Initiated Contact with Patient 02/15/18 1108     (approximate)  I have reviewed the triage vital signs and the nursing notes.   HISTORY  Chief Complaint Foot Pain  HPI Ricky Brennan is a 77 y.o. male presents to the ED with complaint of left foot pain for several weeks.  Patient denies any injury to his foot.  He states it is very painful to stand.  He has not tried any over-the-counter medication for his foot pain.  Past Medical History:  Diagnosis Date  . Diabetes mellitus without complication ([place])   . Hypertension   . Prostate cancer ([place])     There are no active problems to display for this patient.   History reviewed. No pertinent surgical history.  Prior to Admission medications   Medication Sig Start Date End Date Taking? Authorizing Provider  brompheniramine-pseudoephedrine-DM 30-2-10 MG/5ML syrup Take 5 mLs by mouth 4 (four) times daily as needed. 04/26/16   Sable Feil, PA-C  enalapril (VASOTEC) 10 MG tablet Take 10 mg by mouth daily.    [provider]  insulin aspart protamine- aspart (NOVOLOG MIX 70/30) (70-30) 100 UNIT/ML injection Inject 30 Units into the skin 2 (two) times daily with a meal.    [provider]  Rivaroxaban (XARELTO STARTER PACK) 15 & 20 MG TBPK Take as directed on package: Start with one 15mg  tablet by mouth twice a day with food. On Day 22, switch to one 20mg  tablet once a day with food. 12/14/15   Nance Pear, MD  traMADol (ULTRAM) 50 MG tablet Take 1 tablet (50 mg total) by mouth every 12 (twelve) hours as needed for moderate pain. 07/24/15   Sable Feil, PA-C    Allergies Patient has no known allergies.  No family history on file.  Social History Social History   Tobacco Use  . Smoking status: Current Some Day Smoker    Types: Cigars  . Smokeless tobacco: Never Used    Substance Use Topics  . Alcohol use: Yes  . Drug use: Not on file    Review of Systems Constitutional: No fever/chills Cardiovascular: Denies chest pain. Respiratory: Denies shortness of breath. Musculoskeletal: Left foot pain positive. Skin: Negative for rash. Neurological: Negative for  focal weakness or numbness. ___________________________________________   PHYSICAL EXAM:  VITAL SIGNS: ED Triage Vitals  Enc Vitals Group     BP 02/15/18 1054 (!) 143/74     Pulse Rate 02/15/18 1054 83     Resp 02/15/18 1054 (!) 21     Temp 02/15/18 1054 98.3 F (36.8 C)     Temp Source 02/15/18 1054 Oral     SpO2 02/15/18 1054 97 %     Weight 02/15/18 1054 215 lb (97.5 kg)     Height 02/15/18 1054 5\' 11"  (1.803 m)     Head Circumference --      Peak Flow --      Pain Score 02/15/18 1059 0     Pain Loc --      Pain Edu? --      Excl. in [place]? --    Constitutional: Alert and oriented. Well appearing and in no acute distress. Eyes: Conjunctivae are normal.  Head: Atraumatic. Neck: No stridor.   Cardiovascular: Normal rate, regular rhythm. Grossly normal heart sounds.  Good peripheral circulation. Respiratory: Normal respiratory effort.  No retractions. Lungs CTAB. Musculoskeletal: On examination of left  foot plantar aspect there is a small plantars wart present.  Area is tender to touch.  No drainage is noted.  Area is approximately near the third metatarsal distally.  No erythema or warmth is noted in the area.  Motor sensory function is intact to the digits distally. Neurologic:  Normal speech and language. No gross focal neurologic deficits are appreciated. Skin:  Skin is warm, dry and intact.  Psychiatric: Mood and affect are normal. Speech and behavior are normal.  ____________________________________________   LABS (all labs ordered are listed, but only abnormal results are displayed)  Labs Reviewed - No data to  display ____________________________________________  RADIOLOGY  ED MD interpretation:  X-ray of left foot is negative for any bony injury or radiopaque foreign bodies.  Official radiology report(s): Dg Foot Complete Left  Result Date: 02/15/2018 CLINICAL DATA:  77 year old male with a history of left foot pain EXAM: LEFT FOOT - COMPLETE 3+ VIEW COMPARISON:  None. FINDINGS: No acute displaced fracture. Soft tissue swelling on the lateral foot with no radiopaque foreign body. No subcutaneous gas. Degenerative changes of the interphalangeal joints. No erosive bony changes. Degenerative changes of the hindfoot. IMPRESSION: Negative for acute bony abnormality. Soft tissue swelling on the lateral foot, nonspecific. Electronically Signed   By: Corrie Mckusick D.O.   On: 02/15/2018 11:28    ____________________________________________   PROCEDURES  Procedure(s) performed: None  Procedures  Critical Care performed: No  ____________________________________________   INITIAL IMPRESSION / ASSESSMENT AND PLAN / ED COURSE  As part of my medical decision making, I reviewed the following data within the electronic MEDICAL RECORD NUMBER Notes from prior ED visits and [place] Controlled Substance Database  Patient presents to the ED with complaint of left foot pain for several weeks.  He denies any injury to his foot.  On exam there is a plantars wart present that is tender to palpation.  Skin is otherwise intact.  X-rays were benign.  Patient was instructed to either follow-up with the podiatrist listed on his discharge papers or to try the medicated foot cushions that he can obtain over-the-counter at any pharmacy if he so desired. ____________________________________________   FINAL CLINICAL IMPRESSION(S) / ED DIAGNOSES  Final diagnoses:  Plantar wart, left foot     ED Discharge Orders    None       Note:  This document was prepared using Dragon voice recognition software and may include  unintentional dictation errors.    Johaan, [place], PA-C 02/15/18 1713    Harvest Dark, MD 02/17/18 2240

## 2018-02-15 NOTE — ED Triage Notes (Signed)
Pt c/o left foot pain for several weeks.

## 2018-11-05 ENCOUNTER — Encounter: Payer: Self-pay | Admitting: *Deleted

## 2018-11-05 ENCOUNTER — Telehealth: Payer: Self-pay | Admitting: *Deleted

## 2018-11-05 DIAGNOSIS — Z122 Encounter for screening for malignant neoplasm of respiratory organs: Secondary | ICD-10-CM

## 2018-11-05 DIAGNOSIS — Z87891 Personal history of nicotine dependence: Secondary | ICD-10-CM

## 2018-11-05 NOTE — Telephone Encounter (Signed)
Patient has been notified that annual lung cancer screening low dose CT scan is due currently or will be in near future. Confirmed that patient is within the age range of 55-77, and asymptomatic, (no signs or symptoms of lung cancer). Patient denies illness that would prevent curative treatment for lung cancer if found. Verified smoking history, (former, quit 2006, 135 pack year). The shared decision making visit was done 08/04/17. Patient is agreeable for CT scan being scheduled.

## 2018-11-16 ENCOUNTER — Ambulatory Visit: Admission: RE | Admit: 2018-11-16 | Payer: Medicare HMO | Source: Ambulatory Visit

## 2018-11-23 ENCOUNTER — Other Ambulatory Visit: Payer: Self-pay

## 2018-11-23 ENCOUNTER — Ambulatory Visit
Admission: RE | Admit: 2018-11-23 | Discharge: 2018-11-23 | Disposition: A | Discharge status: Home / Self Care | Payer: Medicare HMO | Source: Ambulatory Visit | Attending: Nurse Practitioner | Admitting: Nurse Practitioner

## 2018-11-23 DIAGNOSIS — Z87891 Personal history of nicotine dependence: Secondary | ICD-10-CM | POA: Diagnosis present

## 2018-11-23 DIAGNOSIS — Z122 Encounter for screening for malignant neoplasm of respiratory organs: Secondary | ICD-10-CM | POA: Diagnosis present

## 2018-11-24 ENCOUNTER — Telehealth: Payer: Self-pay | Admitting: *Deleted

## 2018-11-24 NOTE — Telephone Encounter (Signed)
Lung screening CT results are routed to PCP and voicemail left for his administrative assistant to bring his attention to scan results.

## 2018-11-24 NOTE — Telephone Encounter (Signed)
Notified patient of LDCT lung cancer screening program results with recommendation for 12 month follow up imaging. Also notified of incidental findings noted below and is encouraged to discuss further with PCP who will receive a copy of this note and/or the CT report. Patient verbalizes understanding.   Specifically discussed importance of further evaluation of the mediastinal finding. Patient request to have information sent to his PCP who he will be seeing in 2 weeks and will make a decision about management and follow up at that time. Patient is aware that he can contact me if needed.    IMPRESSION: 1. Lung-RADS 2, benign appearance or behavior. Continue annual screening with low-dose chest CT without contrast in 12 months. 2. Aortic atherosclerosis. 3. Mild diffuse bronchial wall thickening with mild centrilobular and paraseptal emphysema; imaging findings suggestive of underlying COPD. 4. 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  Aortic Atherosclerosis (ICD10-I70.0) and Emphysema (ICD10-J43.9).  This examination should have been categorized as Lung-RADS 2S. The "S" modifier refers to potentially clinically significant non lung cancer related findings. Specifically, as mentioned in the body of the report (but not discussed in the original impressions section) is the presence of an enlarging soft tissue attenuation lesion in the anterior mediastinum which currently measures 2.1 x 3.5 cm (previously 1.3 x 2.0 cm). This is of uncertain etiology and significance, and although this could be reactive in the setting of chronic pulmonary infection, the possibility of neoplasm is not excluded. Further evaluation with PET-CT could be considered if there is clinical concern for neoplasm such as thymoma, lymphoma or germ cell neoplasm.

## 2019-05-13 ENCOUNTER — Other Ambulatory Visit: Payer: Self-pay

## 2019-05-13 ENCOUNTER — Encounter (HOSPITAL_COMMUNITY): Payer: Self-pay | Admitting: Emergency Medicine

## 2019-05-13 ENCOUNTER — Inpatient Hospital Stay (HOSPITAL_COMMUNITY)
Admission: EM | Admit: 2019-05-13 | Discharge: 2019-05-22 | DRG: 177 | Disposition: A | Discharge status: Skilled Nursing Facility | Payer: Medicare HMO | Attending: Internal Medicine | Admitting: Internal Medicine

## 2019-05-13 ENCOUNTER — Emergency Department (HOSPITAL_COMMUNITY): Payer: Medicare HMO

## 2019-05-13 DIAGNOSIS — Z79899 Other long term (current) drug therapy: Secondary | ICD-10-CM

## 2019-05-13 DIAGNOSIS — E1165 Type 2 diabetes mellitus with hyperglycemia: Secondary | ICD-10-CM | POA: Diagnosis not present

## 2019-05-13 DIAGNOSIS — R0600 Dyspnea, unspecified: Secondary | ICD-10-CM

## 2019-05-13 DIAGNOSIS — I43 Cardiomyopathy in diseases classified elsewhere: Secondary | ICD-10-CM | POA: Diagnosis present

## 2019-05-13 DIAGNOSIS — M7989 Other specified soft tissue disorders: Secondary | ICD-10-CM | POA: Diagnosis not present

## 2019-05-13 DIAGNOSIS — D649 Anemia, unspecified: Secondary | ICD-10-CM | POA: Diagnosis present

## 2019-05-13 DIAGNOSIS — N1832 Chronic kidney disease, stage 3b: Secondary | ICD-10-CM | POA: Diagnosis present

## 2019-05-13 DIAGNOSIS — E1122 Type 2 diabetes mellitus with diabetic chronic kidney disease: Secondary | ICD-10-CM | POA: Diagnosis present

## 2019-05-13 DIAGNOSIS — E785 Hyperlipidemia, unspecified: Secondary | ICD-10-CM | POA: Diagnosis present

## 2019-05-13 DIAGNOSIS — R222 Localized swelling, mass and lump, trunk: Secondary | ICD-10-CM | POA: Diagnosis present

## 2019-05-13 DIAGNOSIS — I10 Essential (primary) hypertension: Secondary | ICD-10-CM

## 2019-05-13 DIAGNOSIS — I351 Nonrheumatic aortic (valve) insufficiency: Secondary | ICD-10-CM | POA: Diagnosis not present

## 2019-05-13 DIAGNOSIS — Z7901 Long term (current) use of anticoagulants: Secondary | ICD-10-CM

## 2019-05-13 DIAGNOSIS — E1110 Type 2 diabetes mellitus with ketoacidosis without coma: Secondary | ICD-10-CM | POA: Diagnosis not present

## 2019-05-13 DIAGNOSIS — I82432 Acute embolism and thrombosis of left popliteal vein: Secondary | ICD-10-CM | POA: Diagnosis present

## 2019-05-13 DIAGNOSIS — Z8546 Personal history of malignant neoplasm of prostate: Secondary | ICD-10-CM

## 2019-05-13 DIAGNOSIS — Z6827 Body mass index (BMI) 27.0-27.9, adult: Secondary | ICD-10-CM

## 2019-05-13 DIAGNOSIS — I82512 Chronic embolism and thrombosis of left femoral vein: Secondary | ICD-10-CM | POA: Diagnosis present

## 2019-05-13 DIAGNOSIS — T380X5A Adverse effect of glucocorticoids and synthetic analogues, initial encounter: Secondary | ICD-10-CM | POA: Diagnosis present

## 2019-05-13 DIAGNOSIS — E46 Unspecified protein-calorie malnutrition: Secondary | ICD-10-CM

## 2019-05-13 DIAGNOSIS — N179 Acute kidney failure, unspecified: Secondary | ICD-10-CM | POA: Diagnosis not present

## 2019-05-13 DIAGNOSIS — R9431 Abnormal electrocardiogram [ECG] [EKG]: Secondary | ICD-10-CM | POA: Diagnosis present

## 2019-05-13 DIAGNOSIS — R05 Cough: Secondary | ICD-10-CM | POA: Diagnosis present

## 2019-05-13 DIAGNOSIS — I362 Nonrheumatic tricuspid (valve) stenosis with insufficiency: Secondary | ICD-10-CM | POA: Diagnosis not present

## 2019-05-13 DIAGNOSIS — J9601 Acute respiratory failure with hypoxia: Secondary | ICD-10-CM | POA: Diagnosis not present

## 2019-05-13 DIAGNOSIS — N183 Chronic kidney disease, stage 3 unspecified: Secondary | ICD-10-CM | POA: Diagnosis not present

## 2019-05-13 DIAGNOSIS — I82442 Acute embolism and thrombosis of left tibial vein: Secondary | ICD-10-CM | POA: Diagnosis present

## 2019-05-13 DIAGNOSIS — N281 Cyst of kidney, acquired: Secondary | ICD-10-CM | POA: Diagnosis present

## 2019-05-13 DIAGNOSIS — I82412 Acute embolism and thrombosis of left femoral vein: Secondary | ICD-10-CM | POA: Diagnosis not present

## 2019-05-13 DIAGNOSIS — U071 COVID-19: Secondary | ICD-10-CM | POA: Diagnosis not present

## 2019-05-13 DIAGNOSIS — J1282 Pneumonia due to coronavirus disease 2019: Secondary | ICD-10-CM | POA: Diagnosis present

## 2019-05-13 DIAGNOSIS — Z794 Long term (current) use of insulin: Secondary | ICD-10-CM

## 2019-05-13 DIAGNOSIS — E43 Unspecified severe protein-calorie malnutrition: Secondary | ICD-10-CM | POA: Diagnosis present

## 2019-05-13 DIAGNOSIS — Z833 Family history of diabetes mellitus: Secondary | ICD-10-CM

## 2019-05-13 DIAGNOSIS — I1310 Hypertensive heart and chronic kidney disease without heart failure, with stage 1 through stage 4 chronic kidney disease, or unspecified chronic kidney disease: Secondary | ICD-10-CM | POA: Diagnosis present

## 2019-05-13 DIAGNOSIS — R7989 Other specified abnormal findings of blood chemistry: Secondary | ICD-10-CM | POA: Diagnosis not present

## 2019-05-13 DIAGNOSIS — Z87891 Personal history of nicotine dependence: Secondary | ICD-10-CM

## 2019-05-13 DIAGNOSIS — I82409 Acute embolism and thrombosis of unspecified deep veins of unspecified lower extremity: Secondary | ICD-10-CM

## 2019-05-13 DIAGNOSIS — E1121 Type 2 diabetes mellitus with diabetic nephropathy: Secondary | ICD-10-CM | POA: Diagnosis not present

## 2019-05-13 LAB — COMPREHENSIVE METABOLIC PANEL
ALT: 17 U/L (ref 0–44)
AST: 18 U/L (ref 15–41)
Albumin: 2.6 g/dL — ABNORMAL LOW (ref 3.5–5.0)
Alkaline Phosphatase: 70 U/L (ref 38–126)
Anion gap: 17 — ABNORMAL HIGH (ref 5–15)
BUN: 93 mg/dL — ABNORMAL HIGH (ref 8–23)
CO2: 17 mmol/L — ABNORMAL LOW (ref 22–32)
Calcium: 8.2 mg/dL — ABNORMAL LOW (ref 8.9–10.3)
Chloride: 92 mmol/L — ABNORMAL LOW (ref 98–111)
Creatinine, Ser: 4.08 mg/dL — ABNORMAL HIGH (ref 0.61–1.24)
GFR calc Af Amer: 15 mL/min — ABNORMAL LOW (ref >60)
GFR calc non Af Amer: 13 mL/min — ABNORMAL LOW (ref >60)
Glucose, Bld: 871 mg/dL (ref 70–99)
Potassium: 4.5 mmol/L (ref 3.5–5.1)
Sodium: 126 mmol/L — ABNORMAL LOW (ref 135–145)
Total Bilirubin: 1.2 mg/dL (ref 0.3–1.2)
Total Protein: 7.3 g/dL (ref 6.5–8.1)

## 2019-05-13 LAB — POCT I-STAT EG7
Acid-base deficit: 6 mmol/L — ABNORMAL HIGH (ref 0.0–2.0)
Bicarbonate: 17.9 mmol/L — ABNORMAL LOW (ref 20.0–28.0)
Calcium, Ion: 0.91 mmol/L — ABNORMAL LOW (ref 1.15–1.40)
HCT: 42 % (ref 39.0–52.0)
Hemoglobin: 14.3 g/dL (ref 13.0–17.0)
O2 Saturation: 94 %
Potassium: 4.7 mmol/L (ref 3.5–5.1)
Sodium: 129 mmol/L — ABNORMAL LOW (ref 135–145)
TCO2: 19 mmol/L — ABNORMAL LOW (ref 22–32)
pCO2, Ven: 29.9 mmHg — ABNORMAL LOW (ref 44.0–60.0)
pH, Ven: 7.383 (ref 7.250–7.430)
pO2, Ven: 71 mmHg — ABNORMAL HIGH (ref 32.0–45.0)

## 2019-05-13 LAB — BASIC METABOLIC PANEL
Anion gap: 14 (ref 5–15)
BUN: 92 mg/dL — ABNORMAL HIGH (ref 8–23)
CO2: 19 mmol/L — ABNORMAL LOW (ref 22–32)
Calcium: 7.9 mg/dL — ABNORMAL LOW (ref 8.9–10.3)
Chloride: 98 mmol/L (ref 98–111)
Creatinine, Ser: 3.9 mg/dL — ABNORMAL HIGH (ref 0.61–1.24)
GFR calc Af Amer: 16 mL/min — ABNORMAL LOW (ref >60)
GFR calc non Af Amer: 14 mL/min — ABNORMAL LOW (ref >60)
Glucose, Bld: 690 mg/dL (ref 70–99)
Potassium: 4.6 mmol/L (ref 3.5–5.1)
Sodium: 131 mmol/L — ABNORMAL LOW (ref 135–145)

## 2019-05-13 LAB — CBC WITH DIFFERENTIAL/PLATELET
Abs Immature Granulocytes: 0.15 10*3/uL — ABNORMAL HIGH (ref 0.00–0.07)
Basophils Absolute: 0 10*3/uL (ref 0.0–0.1)
Basophils Relative: 0 %
Eosinophils Absolute: 0 10*3/uL (ref 0.0–0.5)
Eosinophils Relative: 0 %
HCT: 38.5 % — ABNORMAL LOW (ref 39.0–52.0)
Hemoglobin: 12.6 g/dL — ABNORMAL LOW (ref 13.0–17.0)
Immature Granulocytes: 1 %
Lymphocytes Relative: 2 %
Lymphs Abs: 0.3 10*3/uL — ABNORMAL LOW (ref 0.7–4.0)
MCH: 28.4 pg (ref 26.0–34.0)
MCHC: 32.7 g/dL (ref 30.0–36.0)
MCV: 86.7 fL (ref 80.0–100.0)
Monocytes Absolute: 0.6 10*3/uL (ref 0.1–1.0)
Monocytes Relative: 5 %
Neutro Abs: 12.5 10*3/uL — ABNORMAL HIGH (ref 1.7–7.7)
Neutrophils Relative %: 92 %
Platelets: 379 10*3/uL (ref 150–400)
RBC: 4.44 MIL/uL (ref 4.22–5.81)
RDW: 13.4 % (ref 11.5–15.5)
WBC: 13.6 10*3/uL — ABNORMAL HIGH (ref 4.0–10.5)
nRBC: 0 % (ref 0.0–0.2)

## 2019-05-13 LAB — FERRITIN: Ferritin: 1188 ng/mL — ABNORMAL HIGH (ref 24–336)

## 2019-05-13 LAB — C-REACTIVE PROTEIN: CRP: 39.6 mg/dL — ABNORMAL HIGH (ref <1.0)

## 2019-05-13 LAB — LACTIC ACID, PLASMA
Lactic Acid, Venous: 2.9 mmol/L (ref 0.5–1.9)
Lactic Acid, Venous: 1.8 mmol/L (ref 0.5–1.9)

## 2019-05-13 LAB — CBG MONITORING, ED
Glucose-Capillary: >600 mg/dL (ref 70–99)
Glucose-Capillary: >600 mg/dL (ref 70–99)
Glucose-Capillary: 504 mg/dL (ref 70–99)
Glucose-Capillary: 596 mg/dL (ref 70–99)

## 2019-05-13 LAB — TROPONIN I (HIGH SENSITIVITY): Troponin I (High Sensitivity): 31 ng/L — ABNORMAL HIGH (ref <18)

## 2019-05-13 LAB — POC SARS CORONAVIRUS 2 AG -  ED: SARS Coronavirus 2 Ag: POSITIVE — AB

## 2019-05-13 LAB — ABO/RH: ABO/RH(D): B POS

## 2019-05-13 LAB — LACTATE DEHYDROGENASE: LDH: 337 U/L — ABNORMAL HIGH (ref 98–192)

## 2019-05-13 LAB — FIBRINOGEN: Fibrinogen: >800 mg/dL — ABNORMAL HIGH (ref 210–475)

## 2019-05-13 LAB — TRIGLYCERIDES: Triglycerides: 135 mg/dL (ref <150)

## 2019-05-13 LAB — BRAIN NATRIURETIC PEPTIDE: B Natriuretic Peptide: 122.7 pg/mL — ABNORMAL HIGH (ref 0.0–100.0)

## 2019-05-13 LAB — PROCALCITONIN: Procalcitonin: 7.39 ng/mL

## 2019-05-13 LAB — D-DIMER, QUANTITATIVE: D-Dimer, Quant: >20 ug/mL-FEU — ABNORMAL HIGH (ref 0.00–0.50)

## 2019-05-13 MED ORDER — DEXTROSE 50 % IV SOLN: 0.0000 mL | INTRAVENOUS | Status: DC | PRN

## 2019-05-13 MED ORDER — POTASSIUM CHLORIDE 10 MEQ/100ML IV SOLN
10.0000 meq | INTRAVENOUS | Status: AC
  Administered 2019-05-13 (×2): 10 meq via INTRAVENOUS
  Filled 2019-05-13 (×2): qty 100

## 2019-05-13 MED ORDER — ACETAMINOPHEN 325 MG PO TABS
650.0000 mg | ORAL_TABLET | Freq: Four times a day (QID) | ORAL | Status: DC | PRN
  Administered 2019-05-13 – 2019-05-17 (×3): 650 mg via ORAL
  Filled 2019-05-13 (×3): qty 2

## 2019-05-13 MED ORDER — PRO-STAT SUGAR FREE PO LIQD
30.0000 mL | Freq: Two times a day (BID) | ORAL | Status: DC
  Administered 2019-05-14 – 2019-05-22 (×16): 30 mL via ORAL
  Filled 2019-05-13 (×17): qty 30

## 2019-05-13 MED ORDER — DEXAMETHASONE 10 MG/ML FOR PEDIATRIC ORAL USE
10.0000 mg | Freq: Once | INTRAMUSCULAR | Status: DC
  Filled 2019-05-13: qty 1

## 2019-05-13 MED ORDER — DEXTROSE-NACL 5-0.45 % IV SOLN
INTRAVENOUS | Status: DC
  Administered 2019-05-14: 50 mL/h via INTRAVENOUS

## 2019-05-13 MED ORDER — SODIUM CHLORIDE 0.9 % IV SOLN
200.0000 mg | Freq: Once | INTRAVENOUS | Status: AC
  Administered 2019-05-13: 200 mg via INTRAVENOUS
  Filled 2019-05-13: qty 40

## 2019-05-13 MED ORDER — DEXAMETHASONE SODIUM PHOSPHATE 10 MG/ML IJ SOLN
6.0000 mg | INTRAMUSCULAR | Status: DC
  Administered 2019-05-13 – 2019-05-17 (×5): 6 mg via INTRAVENOUS
  Filled 2019-05-13 (×5): qty 1

## 2019-05-13 MED ORDER — INSULIN REGULAR(HUMAN) IN NACL 100-0.9 UT/100ML-% IV SOLN
INTRAVENOUS | Status: DC
  Administered 2019-05-13: 11.5 [IU]/h via INTRAVENOUS
  Administered 2019-05-14: 3.4 [IU]/h via INTRAVENOUS
  Filled 2019-05-13 (×3): qty 100

## 2019-05-13 MED ORDER — SODIUM CHLORIDE 0.9 % IV SOLN
100.0000 mg | Freq: Every day | INTRAVENOUS | Status: AC
  Administered 2019-05-14 – 2019-05-17 (×4): 100 mg via INTRAVENOUS
  Filled 2019-05-13 (×4): qty 20

## 2019-05-13 MED ORDER — SODIUM CHLORIDE 0.9 % IV SOLN: INTRAVENOUS | Status: DC

## 2019-05-13 MED ORDER — ENOXAPARIN SODIUM 40 MG/0.4ML ~~LOC~~ SOLN: 40.0000 mg | SUBCUTANEOUS | Status: DC

## 2019-05-13 NOTE — ED Provider Notes (Signed)
[facility] EMERGENCY DEPARTMENT Provider Note   CSN: [id number] Arrival date & time: 05/13/19  1803     History Chief Complaint  Patient presents with  . Shortness of Breath  . Anorexia  . Respiratory Distress   Ricky Brennan is a 79 y.o. male.  HPI  Ricky Brennan is a 79y/o male with PMH of HTN, DM, HLD, and prostate cancer who presents today for one week of progressively worsening breathing with shortness of breath on exertion. He has had associated loss of appetite and cough during the past week as well. In the ED he does complain of pain on deep inspiration but no chest pain otherwise. His shortness of breath has been exertional and slowly getting worse when today he felt short of breath at rest. He denies nausea or vomiting. No fever or chills, no sick contact or known COVID contacts. He states he has not been getting out at all. He is unsure of what medications he takes and states his son had them with him but thinks his son took his medications with him when he left the hospital. In the ED he was having deoxygenation down to the mid 80s on 6L and was put on HFNC at 15L which brought his sats up to the 90s. He does not require oxygen at home.    Past Medical History:  Diagnosis Date  . Diabetes mellitus without complication ([place])   . Hypertension   . Prostate cancer ([place])    There are no problems to display for this patient.  History reviewed. No pertinent surgical history.   No family history on file.  Social History   Tobacco Use  . Smoking status: Former Smoker    Packs/day: 3.00    Years: 45.00    Pack years: 135.00    Types: Cigars, Cigarettes    Quit date: 2006    Years since quitting: 15.0  . Smokeless tobacco: Never Used  Substance Use Topics  . Alcohol use: Yes  . Drug use: Not on file    Home Medications Prior to Admission medications   Medication Sig Start Date End Date Taking? Authorizing Provider    brompheniramine-pseudoephedrine-DM 30-2-10 MG/5ML syrup Take 5 mLs by mouth 4 (four) times daily as needed. 04/26/16   Sable Feil, PA-C  enalapril (VASOTEC) 10 MG tablet Take 10 mg by mouth daily.    [provider]  insulin aspart protamine- aspart (NOVOLOG MIX 70/30) (70-30) 100 UNIT/ML injection Inject 30 Units into the skin 2 (two) times daily with a meal.    [provider]  Rivaroxaban (XARELTO STARTER PACK) 15 & 20 MG TBPK Take as directed on package: Start with one 15mg  tablet by mouth twice a day with food. On Day 22, switch to one 20mg  tablet once a day with food. 12/14/15   Nance Pear, MD  traMADol (ULTRAM) 50 MG tablet Take 1 tablet (50 mg total) by mouth every 12 (twelve) hours as needed for moderate pain. 07/24/15   Sable Feil, PA-C    Allergies    Patient has no known allergies.  Review of Systems   Review of Systems  Constitutional: Positive for activity change, appetite change and fatigue. Negative for chills, diaphoresis and fever.  HENT: Negative for congestion, rhinorrhea, sinus pressure and sinus pain.   Respiratory: Positive for cough, chest tightness and shortness of breath. Negative for wheezing.   Cardiovascular: Negative for chest pain, palpitations and leg swelling.  Gastrointestinal: Negative for abdominal  distention, abdominal pain, constipation, diarrhea, nausea and vomiting.  Genitourinary: Negative for dysuria, frequency and urgency.  Musculoskeletal: Positive for arthralgias.  Skin: Negative for color change and rash.  Neurological: Negative for syncope, light-headedness, numbness and headaches.  Psychiatric/Behavioral: Negative for confusion.    Physical Exam Updated Vital Signs BP (!) 141/81 (BP Location: Right Arm)   Pulse (!) 101   Temp 98.3 F (36.8 C) (Oral)   Resp 13   SpO2 91%   Physical Exam Vitals and nursing note reviewed.  Constitutional:      General: He is not in acute distress.    Appearance: He is  well-developed. He is not ill-appearing.  HENT:     Head: Normocephalic.  Eyes:     Extraocular Movements: Extraocular movements intact.     Pupils: Pupils are equal, round, and reactive to light.  Neck:     Vascular: No JVD.  Cardiovascular:     Rate and Rhythm: Normal rate and regular rhythm.     Heart sounds: No murmur. No gallop.   Pulmonary:     Effort: Pulmonary effort is normal. No respiratory distress.     Breath sounds: Decreased breath sounds present. No wheezing, rhonchi or rales.  Chest:     Chest wall: No tenderness.  Abdominal:     Palpations: Abdomen is soft. There is no mass.     Tenderness: There is no guarding or rebound.     Comments: Hypoactive bowel sounds  Musculoskeletal:     Cervical back: Normal range of motion and neck supple.     Right lower leg: No tenderness. No edema.     Left lower leg: No tenderness. No edema.  Skin:    General: Skin is warm.     Capillary Refill: Capillary refill takes less than 2 seconds.     Findings: No erythema or rash.  Neurological:     General: No focal deficit present.     Mental Status: He is alert and oriented to person, place, and time.     ED Results / Procedures / Treatments   Labs (all labs ordered are listed, but only abnormal results are displayed) Labs Reviewed  CBC WITH DIFFERENTIAL/PLATELET - Abnormal; Notable for the following components:      Result Value   WBC 13.6 (*)    Hemoglobin 12.6 (*)    HCT 38.5 (*)    Neutro Abs 12.5 (*)    Lymphs Abs 0.3 (*)    Abs Immature Granulocytes 0.15 (*)    All other components within normal limits  COMPREHENSIVE METABOLIC PANEL - Abnormal; Notable for the following components:   Sodium 126 (*)    Chloride 92 (*)    CO2 17 (*)    Glucose, Bld 871 (*)    BUN 93 (*)    Creatinine, Ser 4.08 (*)    Calcium 8.2 (*)    Albumin 2.6 (*)    GFR calc non Af Amer 13 (*)    GFR calc Af Amer 15 (*)    Anion gap 17 (*)    All other components within normal limits    POC SARS CORONAVIRUS 2 AG -  ED - Abnormal; Notable for the following components:   SARS Coronavirus 2 Ag POSITIVE (*)    All other components within normal limits  CULTURE, BLOOD (ROUTINE X 2)  CULTURE, BLOOD (ROUTINE X 2)  BRAIN NATRIURETIC PEPTIDE  LACTIC ACID, PLASMA  LACTIC ACID, PLASMA  D-DIMER, QUANTITATIVE (NOT AT [facility])  PROCALCITONIN  LACTATE DEHYDROGENASE  FERRITIN  TRIGLYCERIDES  FIBRINOGEN  C-REACTIVE PROTEIN  BASIC METABOLIC PANEL  BASIC METABOLIC PANEL  BASIC METABOLIC PANEL  BASIC METABOLIC PANEL  BETA-HYDROXYBUTYRIC ACID  BETA-HYDROXYBUTYRIC ACID  URINALYSIS, ROUTINE W REFLEX MICROSCOPIC  I-STAT VENOUS BLOOD GAS, ED  CBG MONITORING, ED  TROPONIN I (HIGH SENSITIVITY)    EKG EKG Interpretation  Date/Time:  Friday May 13 2019 18:31:00 EST Ventricular Rate:  101 PR Interval:  146 QRS Duration: 108 QT Interval:  378 QTC Calculation: 490 R Axis:   -44 Text Interpretation: Sinus tachycardia Left axis deviation Moderate voltage criteria for LVH, may be normal variant ( R in aVL , [employer] product ) Abnormal ECG No significant change since last tracing Confirmed by Wandra Arthurs [phone]) on 05/13/2019 7:43:20 PM   Radiology DG Chest Portable 1 View  Result Date: 05/13/2019 CLINICAL DATA:  Shortness of breath. EXAM: PORTABLE CHEST 1 VIEW COMPARISON:  April 26, 2016 FINDINGS: Mild diffusely increased interstitial lung markings are seen without evidence of acute infiltrate, pleural effusion or pneumothorax. The heart size and mediastinal contours are within normal limits. Degenerative changes seen throughout the thoracic spine. IMPRESSION: 1. Mildly increased interstitial lung markings which are likely chronic in nature. Electronically Signed   By: Virgina Norfolk M.D.   On: 05/13/2019 19:43    Procedures Procedures (including critical care time)  Medications Ordered in ED Medications  insulin regular, human (MYXREDLIN) 100 units/ 100 mL infusion (has no  administration in time range)  0.9 %  sodium chloride infusion (has no administration in time range)  dextrose 5 %-0.45 % sodium chloride infusion (has no administration in time range)  dextrose 50 % solution 0-50 mL (has no administration in time range)  potassium chloride 10 mEq in 100 mL IVPB (has no administration in time range)  dexamethasone (DECADRON) 10 MG/ML injection for Pediatric ORAL use 10 mg (has no administration in time range)    ED Course  I have reviewed the triage vital signs and the nursing notes.  Pertinent labs & imaging results that were available during my care of the patient were reviewed by me and considered in my medical decision making (see chart for details).  Ricky Brennan is a 79y/o male with one week of new onset shortness of breath and fatigue. He has new oxygen requirement and was found to be positive for COVID. He was started on 15L HFNC. He does have sinus tachycardia which has remained stable. COVID labs are pending. CXR was negative but did show nonspecific increased interstitial lung markings.A CMP was performed and he was also found to be in DKA with blood glucose of over 800. He was started on an insulin drip and IV fluids. We did not give him a bolus due to the fact that he is in acute hypoxic respiratory failure secondary to COVID infection. He was also given 10mg  of decadron in the ED.    MDM Rules/Calculators/A&P                     Ricky Brennan was evaluated in Emergency Department on 05/13/2019 for the symptoms described in the history of present illness. He was evaluated in the context of the global COVID-19 pandemic, which necessitated consideration that the patient might be at risk for infection with the SARS-CoV-2 virus that causes COVID-19. Institutional protocols and algorithms that pertain to the evaluation of patients at risk for COVID-19 are in a state of rapid change based on information  released by regulatory bodies including the CDC and  federal and state organizations. These policies and algorithms were followed during the patient's care in the ED.  Final Clinical Impression(s) / ED Diagnoses Final diagnoses:  COVID-19  Diabetic ketoacidosis without coma associated with type 2 diabetes mellitus [facility])    Rx / DC Orders ED Discharge Orders    None       Nuala Alpha, DO 05/13/19 2031    Drenda Freeze, MD 05/13/19 2251

## 2019-05-13 NOTE — ED Notes (Signed)
Pt is desatting on 6lpm. RT called for hi flow [place]. Pt blood glucose elevated and reported to Dr Darl Householder.

## 2019-05-13 NOTE — ED Triage Notes (Signed)
Pt presents with son who has noticed the patients lack of appetite, sob and just not feeling well. On room air his oxygen saturation is 76%. He does not wear home o2. On 6L sats of 91%. A/o X3 at triage.

## 2019-05-13 NOTE — ED Notes (Signed)
SDU 

## 2019-05-13 NOTE — ED Notes (Signed)
Informed Dr Darl Householder of covid pos results

## 2019-05-13 NOTE — ED Notes (Signed)
SpO2 83% on 6 lpm nasal cannula after ambulating from wheelchair to bed and getting undressed.

## 2019-05-13 NOTE — H&P (Signed)
TRH H&P    Patient Demographics:    Ricky Brennan, is a 79 y.o. male  MRN: [id number]  DOB - 07/29/1940  Admit Date - 05/13/2019  Referring MD/NP/PA: Nuala Alpha  Outpatient Primary MD for the patient is Tanda Rockers, MD  Patient coming from: home  Chief complaint- dyspnea   HPI:    Ricky Brennan  is a 79 y.o. male, hypertension, Dm2, h/o prostate cancer, pulmonary nodules (followed by [facility]), anterior mediastinal mass,  apparently presents with complaints of dyspnea x 1 week. Slight dry cough.  Pt notes subjective fever, and changes in sense of taste.  Pt denies cp, palp, n/v, abd pain, diarrhea, brbpr, dysuria, hematuria.   Pt presented to ED due to worsening dyspnea.   In ED,   CXR IMPRESSION: 1. Mildly increased interstitial lung markings which are likely chronic in nature.  Wbc 13.5, Hgb 12.6, Plt 379 Na 126, K 4.5,  Bun 93, Creatinine 4.08  (1.41 (08/15/2014)-> 1.48 (05/03/2015), 1.61 (09/09/2016) -> 2.08 (07/16/2017)-> 1.96 (05/27/2018))  Alb 2.6 Ast 18, Alt 17 BNP 122 Lactic acid 2.9 D dimer >20 LDH 337 Ferritin 1,188 Fibrinogen >800 Procalcitonin 7.39 Crp 39.6  Blood culture x2 pending  poc sars +  Pt will be admitted for dyspnea secndary to covid-19 infection, acute renal failure, hyperglycemia,     Review of systems:    In addition to the HPI above,  No Fever-chills, No Headache, No changes with Vision or hearing, No problems swallowing food or Liquids, No Chest pain,   No Abdominal pain, No Nausea or Vomiting, bowel movements are regular, No Blood in stool or Urine, No dysuria, No new skin rashes or bruises, No new joints pains-aches,  No new weakness, tingling, numbness in any extremity, No recent weight gain or loss, No polyuria, polydypsia or polyphagia, No significant Mental Stressors.  All other systems reviewed and are negative.    Past History of the  following :    Past Medical History:  Diagnosis Date  . Diabetes mellitus without complication ([place])   . Hypertension   . Prostate cancer [facility])       Past Surgical History:  Procedure Laterality Date  . PROSTATE SURGERY        Social History:      Social History   Tobacco Use  . Smoking status: Former Smoker    Packs/day: 3.00    Years: 45.00    Pack years: 135.00    Types: Cigars, Cigarettes    Quit date: 2006    Years since quitting: 15.0  . Smokeless tobacco: Never Used  Substance Use Topics  . Alcohol use: Yes       Family History :     Family History  Problem Relation Age of Onset  . Diabetes Mother   . Cancer Father   . Diabetes Brother        Home Medications:   Prior to Admission medications   Medication Sig Start Date End Date Taking? Authorizing Provider  amLODipine (NORVASC) 10 MG tablet Take 10 mg by  mouth daily. 05/27/18 05/28/19 Yes [provider]  enalapril (VASOTEC) 20 MG tablet Take 20 mg by mouth 2 (two) times daily. 11/22/18  Yes [provider]  guaifenesin (ROBITUSSIN) 100 MG/5ML syrup Take 200 mg by mouth 3 (three) times daily as needed for cough.   Yes [provider]  hydrochlorothiazide (HYDRODIURIL) 25 MG tablet Take 25 mg by mouth daily. 11/29/18  Yes [provider]  insulin NPH Human (NOVOLIN N) 100 UNIT/ML injection Inject 30-40 Units into the skin See admin instructions. Inject 40 units subcutaneously in the morning if CBG >200, inject 30 units subcutaneously at night if CBG >200 12/17/18  Yes [provider]     Allergies:    No Known Allergies   Physical Exam:   Vitals  Blood pressure 125/74, pulse 96, temperature 98.3 F (36.8 C), temperature source Oral, resp. rate (!) 26, SpO2 96 %.  1.  General: axoxo3  2. Psychiatric: euthymic  3. Neurologic: Nonfocal, cn2-12 intact, reflexes 2+ symmetric, diffuse with no clonus, motor 5/5 in all 4 ext  4. HEENMT:  Anicteric,  pupils 1.49m symmetric, direct ,consensual, near intact Neck: no jvd,   5. Respiratory : + bibasilar crackles. Left > right, no wheezing  6. Cardiovascular : rrr s1, s2, no m/g/r  7. Gastrointestinal:  Abd: soft, obese, nt, nd, +bs  8. Skin:  Ext: no c/c/e, no rash  9.Musculoskeletal:  Good ROM    Data Review:    CBC Recent Labs  Lab 05/13/19 1843 05/13/19 2020  WBC 13.6*  --   HGB 12.6* 14.3  HCT 38.5* 42.0  PLT 379  --   MCV 86.7  --   MCH 28.4  --   MCHC 32.7  --   RDW 13.4  --   LYMPHSABS 0.3*  --   MONOABS 0.6  --   EOSABS 0.0  --   BASOSABS 0.0  --    ------------------------------------------------------------------------------------------------------------------  Results for orders placed or performed during the hospital encounter of 05/13/19 (from the past 48 hour(s))  CBC with Differential     Status: Abnormal   Collection Time: 05/13/19  6:43 PM  Result Value Ref Range   WBC 13.6 (H) 4.0 - 10.5 K/uL   RBC 4.44 4.22 - 5.81 MIL/uL   Hemoglobin 12.6 (L) 13.0 - 17.0 g/dL   HCT 38.5 (L) 39.0 - 52.0 %   MCV 86.7 80.0 - 100.0 fL   MCH 28.4 26.0 - 34.0 pg   MCHC 32.7 30.0 - 36.0 g/dL   RDW 13.4 11.5 - 15.5 %   Platelets 379 150 - 400 K/uL   nRBC 0.0 0.0 - 0.2 %   Neutrophils Relative % 92 %   Neutro Abs 12.5 (H) 1.7 - 7.7 K/uL   Lymphocytes Relative 2 %   Lymphs Abs 0.3 (L) 0.7 - 4.0 K/uL   Monocytes Relative 5 %   Monocytes Absolute 0.6 0.1 - 1.0 K/uL   Eosinophils Relative 0 %   Eosinophils Absolute 0.0 0.0 - 0.5 K/uL   Basophils Relative 0 %   Basophils Absolute 0.0 0.0 - 0.1 K/uL   Immature Granulocytes 1 %   Abs Immature Granulocytes 0.15 (H) 0.00 - 0.07 K/uL    Comment: Performed at [facility] [address], [place] [address] Comprehensive metabolic panel     Status: Abnormal   Collection Time: 05/13/19  6:43 PM  Result Value Ref Range   Sodium 126 (L) 135 - 145 mmol/L  Potassium 4.5 3.5 - 5.1 mmol/L   Chloride 92  (L) 98 - 111 mmol/L   CO2 17 (L) 22 - 32 mmol/L   Glucose, Bld 871 (HH) 70 - 99 mg/dL    Comment: CRITICAL RESULT CALLED TO, READ BACK BY AND VERIFIED WITH: A CAIN RN AT 2010 ON [id number] BY K FORSYTH    BUN 93 (H) 8 - 23 mg/dL   Creatinine, Ser 4.08 (H) 0.61 - 1.24 mg/dL   Calcium 8.2 (L) 8.9 - 10.3 mg/dL   Total Protein 7.3 6.5 - 8.1 g/dL   Albumin 2.6 (L) 3.5 - 5.0 g/dL   AST 18 15 - 41 U/L   ALT 17 0 - 44 U/L   Alkaline Phosphatase 70 38 - 126 U/L   Total Bilirubin 1.2 0.3 - 1.2 mg/dL   GFR calc non Af Amer 13 (L) >60 mL/min   GFR calc Af Amer 15 (L) >60 mL/min   Anion gap 17 (H) 5 - 15    Comment: Performed at [facility] [address] [place], [place] [address]  Brain natriuretic peptide     Status: Abnormal   Collection Time: 05/13/19  7:41 PM  Result Value Ref Range   B Natriuretic Peptide 122.7 (H) 0.0 - 100.0 pg/mL    Comment: Performed at [facility] [address] [place], [place] [address]  Lactic acid, plasma     Status: Abnormal   Collection Time: 05/13/19  7:45 PM  Result Value Ref Range   Lactic Acid, Venous 2.9 (HH) 0.5 - 1.9 mmol/L    Comment: CRITICAL RESULT CALLED TO, READ BACK BY AND VERIFIED WITH: RN T LOCKAMY @2143  05/13/19 BY S GEZAHEGN Performed at [facility], [address], [place], [place] [address]   Triglycerides     Status: None   Collection Time: 05/13/19  7:45 PM  Result Value Ref Range   Triglycerides 135 <150 mg/dL    Comment: Performed at [facility] [address], [place], [place] [address]  Troponin I (High Sensitivity)     Status: Abnormal   Collection Time: 05/13/19  7:55 PM  Result Value Ref Range   Troponin I (High Sensitivity) 31 (H) <18 ng/L    Comment: (NOTE) Elevated high sensitivity troponin I (hsTnI) values and significant  changes across serial measurements may suggest ACS but many other  chronic and acute conditions are known to elevate hsTnI results.  Refer to the "Links" section for chest  pain algorithms and additional  guidance. Performed at [facility], [place] [address] [place], [place] [address]   D-dimer, quantitative     Status: Abnormal   Collection Time: 05/13/19  7:55 PM  Result Value Ref Range   D-Dimer, Quant >20.00 (H) 0.00 - 0.50 ug/mL-FEU    Comment: (NOTE) At the manufacturer cut-off of 0.50 ug/mL FEU, this assay has been documented to exclude PE with a sensitivity and negative predictive value of 97 to 99%.  At this time, this assay has not been approved by the FDA to exclude DVT/VTE. Results should be correlated with clinical presentation. Performed at [facility], [place] [address], [place],  [address]   Procalcitonin     Status: None   Collection Time: 05/13/19  7:55 PM  Result Value Ref Range   Procalcitonin 7.39 ng/mL    Comment:        Interpretation: PCT > 2 ng/mL: Systemic infection (sepsis) is likely, unless  other causes are known. (NOTE)       Sepsis PCT Algorithm           Lower Respiratory Tract                                      Infection PCT Algorithm    ----------------------------     ----------------------------         PCT < 0.25 ng/mL                PCT < 0.10 ng/mL         Strongly encourage             Strongly discourage   discontinuation of antibiotics    initiation of antibiotics    ----------------------------     -----------------------------       PCT 0.25 - 0.50 ng/mL            PCT 0.10 - 0.25 ng/mL               OR       >80% decrease in PCT            Discourage initiation of                                            antibiotics      Encourage discontinuation           of antibiotics    ----------------------------     -----------------------------         PCT >= 0.50 ng/mL              PCT 0.26 - 0.50 ng/mL               AND       <80% decrease in PCT              Encourage initiation of                                             antibiotics       Encourage continuation           of  antibiotics    ----------------------------     -----------------------------        PCT >= 0.50 ng/mL                  PCT > 0.50 ng/mL               AND         increase in PCT                  Strongly encourage                                      initiation of antibiotics    Strongly encourage escalation           of antibiotics                                     -----------------------------  PCT <= 0.25 ng/mL                                                 OR                                        > 80% decrease in PCT                                     Discontinue / Do not initiate                                             antibiotics Performed at [facility], [place] [address], [place], [place] [address]   Lactate dehydrogenase     Status: Abnormal   Collection Time: 05/13/19  7:55 PM  Result Value Ref Range   LDH 337 (H) 98 - 192 U/L    Comment: Performed at [facility], [place] [address] [place], [place] [address]  Ferritin     Status: Abnormal   Collection Time: 05/13/19  7:55 PM  Result Value Ref Range   Ferritin 1,188 (H) 24 - 336 ng/mL    Comment: Performed at [facility], [place] [address] [place], [place] [address]  Fibrinogen     Status: Abnormal   Collection Time: 05/13/19  7:55 PM  Result Value Ref Range   Fibrinogen >800 (H) 210 - 475 mg/dL    Comment: REPEATED TO VERIFY Performed at [facility] [address], [place], [place] [address]   C-reactive protein     Status: Abnormal   Collection Time: 05/13/19  7:55 PM  Result Value Ref Range   CRP 39.6 (H) <1.0 mg/dL    Comment: Performed at [facility], [place] [address] [place], [place] [address]  POC SARS Coronavirus 2 Ag-ED - Nasal Swab (BD Veritor Kit)     Status: Abnormal   Collection Time: 05/13/19  8:14 PM  Result Value Ref Range   SARS Coronavirus 2 Ag POSITIVE (A) NEGATIVE    Comment: (NOTE) SARS-CoV-2 antigen  PRESENT. Positive results indicate the presence of viral antigens, but clinical correlation with patient history and other diagnostic information is necessary to determine patient infection status.  Positive results do not rule out bacterial infection or co-infection  with other viruses. False positive results are rare but can occur, and confirmatory RT-PCR testing may be appropriate in some circumstances. The expected result is Negative. Fact Sheet for Patients: PodPark.tn Fact Sheet for Providers: GiftContent.is  This test is not yet approved or cleared by the [place] FDA and  has been authorized for detection and/or diagnosis of SARS-CoV-2 by FDA under an Emergency Use Authorization (EUA).  This EUA will remain in effect (meaning this test can be used) for the duration of  the COVID-19 declaration under Section 564(b)(1) of the Act, 21 U.S.C. section 360bbb-3(b)(1), unless the a uthorization is terminated or revoked sooner.   POCT I-Stat EG7     Status: Abnormal   Collection Time: 05/13/19  8:20 PM  Result Value Ref Range   pH, Ven 7.383 7.250 - 7.430   pCO2, Ven 29.9 (L) 44.0 - 60.0 mmHg   pO2, Ven 71.0 (H) 32.0 - 45.0 mmHg   Bicarbonate 17.9 (L) 20.0 - 28.0 mmol/L   TCO2 19 (L) 22 - 32 mmol/L   O2 Saturation 94.0 %   Acid-base deficit 6.0 (H) 0.0 - 2.0 mmol/L   Sodium 129 (L) 135 - 145 mmol/L   Potassium 4.7 3.5 - 5.1 mmol/L   Calcium, Ion 0.91 (L) 1.15 - 1.40 mmol/L   HCT 42.0 39.0 - 52.0 %   Hemoglobin 14.3 13.0 - 17.0 g/dL   Patient temperature HIDE    Sample type VENOUS   Basic metabolic panel     Status: Abnormal   Collection Time: 05/13/19  8:56 PM  Result Value Ref Range   Sodium 131 (L) 135 - 145 mmol/L   Potassium 4.6 3.5 - 5.1 mmol/L   Chloride 98 98 - 111 mmol/L   CO2 19 (L) 22 - 32 mmol/L   Glucose, Bld 690 (HH) 70 - 99 mg/dL    Comment: CRITICAL RESULT CALLED TO, READ BACK BY AND VERIFIED  WITH: S.TOWNES RN 2309 05/13/19 MCCORMICK K    BUN 92 (H) 8 - 23 mg/dL   Creatinine, Ser 3.90 (H) 0.61 - 1.24 mg/dL   Calcium 7.9 (L) 8.9 - 10.3 mg/dL   GFR calc non Af Amer 14 (L) >60 mL/min   GFR calc Af Amer 16 (L) >60 mL/min   Anion gap 14 5 - 15    Comment: Performed at [facility], [place] [address] [place], [place] [address]  ABO/Rh     Status: None   Collection Time: 05/13/19  9:12 PM  Result Value Ref Range   ABO/RH(D)      B POS Performed at [facility] [address] [place], [place] [address]   CBG monitoring, ED     Status: Abnormal   Collection Time: 05/13/19  9:16 PM  Result Value Ref Range   Glucose-Capillary >600 (HH) 70 - 99 mg/dL  Lactic acid, plasma     Status: None   Collection Time: 05/13/19  9:45 PM  Result Value Ref Range   Lactic Acid, Venous 1.8 0.5 - 1.9 mmol/L    Comment: Performed at [facility] [address] [place],  [address]  CBG monitoring, ED     Status: Abnormal   Collection Time: 05/13/19 10:01 PM  Result Value Ref Range   Glucose-Capillary >600 (HH) 70 - 99 mg/dL  CBG monitoring, ED     Status: Abnormal   Collection Time: 05/13/19 10:54 PM  Result Value Ref Range   Glucose-Capillary 596 (HH) 70 - 99 mg/dL   Comment 1 Notify RN   CBG monitoring, ED     Status: Abnormal   Collection Time: 05/13/19 11:25 PM  Result Value Ref Range   Glucose-Capillary 504 (HH) 70 - 99 mg/dL    Chemistries  Recent Labs  Lab 05/13/19 1843 05/13/19 2020 05/13/19 2056  NA 126* 129* 131*  K 4.5 4.7 4.6  CL 92*  --  98  CO2 17*  --  19*  GLUCOSE 871*  --  690*  BUN 93*  --  92*  CREATININE 4.08*  --  3.90*  CALCIUM 8.2*  --  7.9*  AST 18  --   --   ALT 17  --   --  ALKPHOS 70  --   --   BILITOT 1.2  --   --     ------------------------------------------------------------------------------------------------------------------  ------------------------------------------------------------------------------------------------------------------ GFR: CrCl cannot be calculated (Unknown ideal weight.). Liver Function Tests: Recent Labs  Lab 05/13/19 1843  AST 18  ALT 17  ALKPHOS 70  BILITOT 1.2  PROT 7.3  ALBUMIN 2.6*   No results for input(s): LIPASE, AMYLASE in the last 168 hours. No results for input(s): AMMONIA in the last 168 hours. Coagulation Profile: No results for input(s): INR, PROTIME in the last 168 hours. Cardiac Enzymes: No results for input(s): CKTOTAL, CKMB, CKMBINDEX, TROPONINI in the last 168 hours. BNP (last 3 results) No results for input(s): PROBNP in the last 8760 hours. HbA1C: No results for input(s): HGBA1C in the last 72 hours. CBG: Recent Labs  Lab 05/13/19 2116 05/13/19 2201 05/13/19 2254 05/13/19 2325  GLUCAP >600* >600* 596* 504*   Lipid Profile: Recent Labs    05/13/19 1945  TRIG 135   Thyroid Function Tests: No results for input(s): TSH, T4TOTAL, FREET4, T3FREE, THYROIDAB in the last 72 hours. Anemia Panel: Recent Labs    05/13/19 1955  FERRITIN 1,188*    --------------------------------------------------------------------------------------------------------------- Urine analysis: No results found for: COLORURINE, APPEARANCEUR, LABSPEC, PHURINE, GLUCOSEU, HGBUR, BILIRUBINUR, KETONESUR, PROTEINUR, UROBILINOGEN, NITRITE, LEUKOCYTESUR    Imaging Results:    DG Chest Portable 1 View  Result Date: 05/13/2019 CLINICAL DATA:  Shortness of breath. EXAM: PORTABLE CHEST 1 VIEW COMPARISON:  April 26, 2016 FINDINGS: Mild diffusely increased interstitial lung markings are seen without evidence of acute infiltrate, pleural effusion or pneumothorax. The heart size and mediastinal contours are within normal limits. Degenerative changes seen throughout the  thoracic spine. IMPRESSION: 1. Mildly increased interstitial lung markings which are likely chronic in nature. Electronically Signed   By: Virgina Norfolk M.D.   On: 05/13/2019 19:43      Assessment & Plan:    Active Problems:   Acute respiratory failure with hypoxia (HCC)   Dyspnea  Covid-19 infection Dexamethasone 67m iv qday Remdesivir pharmacy consult  DKA NPO NS iv Insulin GTT Check bmp q4h   ARF  STOP vasotec STOP Hydrochlorothiazide Check urine sodium, urine prot/ creatinine, urine eosinophils Spep, immunofixation Check renal ultrasound Hydrate with ns iv Check cmp in am   Elevated D dimer  VQ scan r/o PE Heparin GTT-> til VQ scan results  Severe protein calorie malnutrition prostat 311mpo bid  Leukocytosis Anemia Check cbc in am  Hypertension Cont Amlodipine 1061mo qday Hydralazine 88m59m q 6h prn sbp >160  Multiple pulmonary nodules , and anterior mediastinal mass Outpatient follow up please  DVT Prophylaxis-  Heparin gtt  - SCDs   AM Labs Ordered, also please review Full Orders  Family Communication: Admission, patients condition and plan of care including tests being ordered have been discussed with the patient who indicate understanding and agree with the plan and Code Status.  Code Status:  FULL CODE per patient, attempted to notify  son that patient will be admitted to [facility] response  Admission status:  Inpatient: Based on patients clinical presentation and evaluation of above clinical data, I have made determination that patient meets Inpatient criteria at this time.  Time spent in minutes : 70 minutes   JameJani Gravel on 05/13/2019 at 11:35 PM

## 2019-05-13 NOTE — Progress Notes (Signed)
Placed pt. On HFNC per MD. 

## 2019-05-13 NOTE — ED Notes (Signed)
[facility] called for transport. 

## 2019-05-14 ENCOUNTER — Other Ambulatory Visit: Payer: Self-pay

## 2019-05-14 ENCOUNTER — Inpatient Hospital Stay (HOSPITAL_COMMUNITY): Payer: Medicare HMO

## 2019-05-14 DIAGNOSIS — N179 Acute kidney failure, unspecified: Secondary | ICD-10-CM | POA: Diagnosis present

## 2019-05-14 DIAGNOSIS — U071 COVID-19: Secondary | ICD-10-CM | POA: Insufficient documentation

## 2019-05-14 DIAGNOSIS — I351 Nonrheumatic aortic (valve) insufficiency: Secondary | ICD-10-CM

## 2019-05-14 DIAGNOSIS — I362 Nonrheumatic tricuspid (valve) stenosis with insufficiency: Secondary | ICD-10-CM

## 2019-05-14 DIAGNOSIS — E1110 Type 2 diabetes mellitus with ketoacidosis without coma: Secondary | ICD-10-CM

## 2019-05-14 LAB — COMPREHENSIVE METABOLIC PANEL
ALT: 18 U/L (ref 0–44)
ALT: 26 U/L (ref 0–44)
AST: 21 U/L (ref 15–41)
AST: 53 U/L — ABNORMAL HIGH (ref 15–41)
Albumin: 2.9 g/dL — ABNORMAL LOW (ref 3.5–5.0)
Albumin: 2.8 g/dL — ABNORMAL LOW (ref 3.5–5.0)
Alkaline Phosphatase: 64 U/L (ref 38–126)
Alkaline Phosphatase: 65 U/L (ref 38–126)
Anion gap: 15 (ref 5–15)
Anion gap: 13 (ref 5–15)
BUN: 93 mg/dL — ABNORMAL HIGH (ref 8–23)
BUN: 89 mg/dL — ABNORMAL HIGH (ref 8–23)
CO2: 20 mmol/L — ABNORMAL LOW (ref 22–32)
CO2: 20 mmol/L — ABNORMAL LOW (ref 22–32)
Calcium: 8.8 mg/dL — ABNORMAL LOW (ref 8.9–10.3)
Calcium: 8.9 mg/dL (ref 8.9–10.3)
Chloride: 103 mmol/L (ref 98–111)
Chloride: 107 mmol/L (ref 98–111)
Creatinine, Ser: 3.65 mg/dL — ABNORMAL HIGH (ref 0.61–1.24)
Creatinine, Ser: 3 mg/dL — ABNORMAL HIGH (ref 0.61–1.24)
GFR calc Af Amer: 18 mL/min — ABNORMAL LOW (ref >60)
GFR calc Af Amer: 22 mL/min — ABNORMAL LOW (ref >60)
GFR calc non Af Amer: 15 mL/min — ABNORMAL LOW (ref >60)
GFR calc non Af Amer: 19 mL/min — ABNORMAL LOW (ref >60)
Glucose, Bld: 230 mg/dL — ABNORMAL HIGH (ref 70–99)
Glucose, Bld: 218 mg/dL — ABNORMAL HIGH (ref 70–99)
Potassium: 4.2 mmol/L (ref 3.5–5.1)
Potassium: 4 mmol/L (ref 3.5–5.1)
Sodium: 138 mmol/L (ref 135–145)
Sodium: 140 mmol/L (ref 135–145)
Total Bilirubin: 0.7 mg/dL (ref 0.3–1.2)
Total Bilirubin: 0.8 mg/dL (ref 0.3–1.2)
Total Protein: 7.4 g/dL (ref 6.5–8.1)
Total Protein: 7.5 g/dL (ref 6.5–8.1)

## 2019-05-14 LAB — BASIC METABOLIC PANEL
Anion gap: 15 (ref 5–15)
Anion gap: 13 (ref 5–15)
Anion gap: 16 — ABNORMAL HIGH (ref 5–15)
BUN: 97 mg/dL — ABNORMAL HIGH (ref 8–23)
BUN: 95 mg/dL — ABNORMAL HIGH (ref 8–23)
BUN: 98 mg/dL — ABNORMAL HIGH (ref 8–23)
CO2: 19 mmol/L — ABNORMAL LOW (ref 22–32)
CO2: 21 mmol/L — ABNORMAL LOW (ref 22–32)
CO2: 20 mmol/L — ABNORMAL LOW (ref 22–32)
Calcium: 8.7 mg/dL — ABNORMAL LOW (ref 8.9–10.3)
Calcium: 8.6 mg/dL — ABNORMAL LOW (ref 8.9–10.3)
Calcium: 8.7 mg/dL — ABNORMAL LOW (ref 8.9–10.3)
Chloride: 100 mmol/L (ref 98–111)
Chloride: 105 mmol/L (ref 98–111)
Chloride: 104 mmol/L (ref 98–111)
Creatinine, Ser: 3.9 mg/dL — ABNORMAL HIGH (ref 0.61–1.24)
Creatinine, Ser: 3.36 mg/dL — ABNORMAL HIGH (ref 0.61–1.24)
Creatinine, Ser: 3.41 mg/dL — ABNORMAL HIGH (ref 0.61–1.24)
GFR calc Af Amer: 16 mL/min — ABNORMAL LOW (ref >60)
GFR calc Af Amer: 19 mL/min — ABNORMAL LOW (ref >60)
GFR calc Af Amer: 19 mL/min — ABNORMAL LOW (ref >60)
GFR calc non Af Amer: 14 mL/min — ABNORMAL LOW (ref >60)
GFR calc non Af Amer: 17 mL/min — ABNORMAL LOW (ref >60)
GFR calc non Af Amer: 16 mL/min — ABNORMAL LOW (ref >60)
Glucose, Bld: 437 mg/dL — ABNORMAL HIGH (ref 70–99)
Glucose, Bld: 213 mg/dL — ABNORMAL HIGH (ref 70–99)
Glucose, Bld: 210 mg/dL — ABNORMAL HIGH (ref 70–99)
Potassium: 4.8 mmol/L (ref 3.5–5.1)
Potassium: 4.3 mmol/L (ref 3.5–5.1)
Potassium: 4 mmol/L (ref 3.5–5.1)
Sodium: 134 mmol/L — ABNORMAL LOW (ref 135–145)
Sodium: 139 mmol/L (ref 135–145)
Sodium: 140 mmol/L (ref 135–145)

## 2019-05-14 LAB — CBC WITH DIFFERENTIAL/PLATELET
Abs Immature Granulocytes: 0.08 10*3/uL — ABNORMAL HIGH (ref 0.00–0.07)
Basophils Absolute: 0 10*3/uL (ref 0.0–0.1)
Basophils Relative: 0 %
Eosinophils Absolute: 0 10*3/uL (ref 0.0–0.5)
Eosinophils Relative: 0 %
HCT: 36.4 % — ABNORMAL LOW (ref 39.0–52.0)
Hemoglobin: 12.3 g/dL — ABNORMAL LOW (ref 13.0–17.0)
Immature Granulocytes: 1 %
Lymphocytes Relative: 3 %
Lymphs Abs: 0.4 10*3/uL — ABNORMAL LOW (ref 0.7–4.0)
MCH: 28.3 pg (ref 26.0–34.0)
MCHC: 33.8 g/dL (ref 30.0–36.0)
MCV: 83.7 fL (ref 80.0–100.0)
Monocytes Absolute: 0.5 10*3/uL (ref 0.1–1.0)
Monocytes Relative: 4 %
Neutro Abs: 11.4 10*3/uL — ABNORMAL HIGH (ref 1.7–7.7)
Neutrophils Relative %: 92 %
Platelets: 349 10*3/uL (ref 150–400)
RBC: 4.35 MIL/uL (ref 4.22–5.81)
RDW: 13.2 % (ref 11.5–15.5)
WBC: 12.4 10*3/uL — ABNORMAL HIGH (ref 4.0–10.5)
nRBC: 0 % (ref 0.0–0.2)

## 2019-05-14 LAB — GLUCOSE, CAPILLARY
Glucose-Capillary: 238 mg/dL — ABNORMAL HIGH (ref 70–99)
Glucose-Capillary: 226 mg/dL — ABNORMAL HIGH (ref 70–99)
Glucose-Capillary: 251 mg/dL — ABNORMAL HIGH (ref 70–99)
Glucose-Capillary: 244 mg/dL — ABNORMAL HIGH (ref 70–99)
Glucose-Capillary: 231 mg/dL — ABNORMAL HIGH (ref 70–99)
Glucose-Capillary: 208 mg/dL — ABNORMAL HIGH (ref 70–99)
Glucose-Capillary: 193 mg/dL — ABNORMAL HIGH (ref 70–99)
Glucose-Capillary: 199 mg/dL — ABNORMAL HIGH (ref 70–99)
Glucose-Capillary: 203 mg/dL — ABNORMAL HIGH (ref 70–99)
Glucose-Capillary: 193 mg/dL — ABNORMAL HIGH (ref 70–99)
Glucose-Capillary: 219 mg/dL — ABNORMAL HIGH (ref 70–99)
Glucose-Capillary: 246 mg/dL — ABNORMAL HIGH (ref 70–99)
Glucose-Capillary: 225 mg/dL — ABNORMAL HIGH (ref 70–99)
Glucose-Capillary: 223 mg/dL — ABNORMAL HIGH (ref 70–99)
Glucose-Capillary: 217 mg/dL — ABNORMAL HIGH (ref 70–99)
Glucose-Capillary: 217 mg/dL — ABNORMAL HIGH (ref 70–99)
Glucose-Capillary: 235 mg/dL — ABNORMAL HIGH (ref 70–99)

## 2019-05-14 LAB — URINALYSIS, ROUTINE W REFLEX MICROSCOPIC
Bilirubin Urine: NEGATIVE
Glucose, UA: 50 mg/dL — AB
Ketones, ur: NEGATIVE mg/dL
Leukocytes,Ua: NEGATIVE
Nitrite: NEGATIVE
Protein, ur: 30 mg/dL — AB
Specific Gravity, Urine: 1.016 (ref 1.005–1.030)
pH: 5 (ref 5.0–8.0)

## 2019-05-14 LAB — HEPARIN LEVEL (UNFRACTIONATED)
Heparin Unfractionated: 0.24 IU/mL — ABNORMAL LOW (ref 0.30–0.70)
Heparin Unfractionated: 0.43 IU/mL (ref 0.30–0.70)

## 2019-05-14 LAB — ABO/RH: ABO/RH(D): B POS

## 2019-05-14 LAB — ECHOCARDIOGRAM COMPLETE
Height: 71 in
Weight: 3156.99 oz

## 2019-05-14 LAB — CBG MONITORING, ED: Glucose-Capillary: 419 mg/dL — ABNORMAL HIGH (ref 70–99)

## 2019-05-14 MED ORDER — HEPARIN BOLUS VIA INFUSION
4000.0000 [IU] | Freq: Once | INTRAVENOUS | Status: AC
  Administered 2019-05-14: 4000 [IU] via INTRAVENOUS
  Filled 2019-05-14: qty 4000

## 2019-05-14 MED ORDER — AZITHROMYCIN 250 MG PO TABS
500.0000 mg | ORAL_TABLET | Freq: Every day | ORAL | Status: AC
  Administered 2019-05-14 – 2019-05-18 (×5): 500 mg via ORAL
  Filled 2019-05-14 (×7): qty 2

## 2019-05-14 MED ORDER — HEPARIN (PORCINE) 25000 UT/250ML-% IV SOLN
1450.0000 [IU]/h | INTRAVENOUS | Status: DC
  Administered 2019-05-14: 1600 [IU]/h via INTRAVENOUS
  Administered 2019-05-14 – 2019-05-15 (×3): 1800 [IU]/h via INTRAVENOUS
  Administered 2019-05-16: 1700 [IU]/h via INTRAVENOUS
  Administered 2019-05-17: 1300 [IU]/h via INTRAVENOUS
  Administered 2019-05-19: 1450 [IU]/h via INTRAVENOUS
  Filled 2019-05-14 (×7): qty 250

## 2019-05-14 MED ORDER — SODIUM CHLORIDE 0.9% IV SOLUTION: Freq: Once | INTRAVENOUS | Status: AC

## 2019-05-14 MED ORDER — SODIUM CHLORIDE 0.9 % IV SOLN
1.0000 g | INTRAVENOUS | Status: AC
  Administered 2019-05-14 – 2019-05-18 (×5): 1 g via INTRAVENOUS
  Filled 2019-05-14 (×5): qty 10

## 2019-05-14 NOTE — Progress Notes (Addendum)
ANTICOAGULATION CONSULT NOTE  Pharmacy Consult for heparin Indication: r/o VTE in setting of Covid infection  No Known Allergies  Patient Measurements: Height: 5\' 11"  (180.3 cm) Weight: 197 lb 5 oz (89.5 kg) IBW/kg (Calculated) : 75.3 Heparin Dosing Weight: 95kg  Vital Signs: Temp: 97.6 F (36.4 C) (01/23 2138) Temp Source: Oral (01/23 2103) BP: 126/64 (01/23 2000) Pulse Rate: 97 (01/23 2138)  Labs: Recent Labs    05/13/19 1843 05/13/19 1843 05/13/19 1955 05/13/19 2020 05/13/19 2056 05/14/19 0329 05/14/19 0329 05/14/19 0915 05/14/19 1150 05/14/19 1830 05/14/19 2053  HGB 12.6*   < >  --  14.3  --  12.3*  --   --   --   --   --   HCT 38.5*  --   --  42.0  --  36.4*  --   --   --   --   --   PLT 379  --   --   --   --  349  --   --   --   --   --   HEPARINUNFRC  --   --   --   --   --   --   --  0.24*  --   --  0.43  CREATININE 4.08*  --   --   --    < > 3.65*   < > 3.36* 3.41* 3.00*  --   TROPONINIHS  --   --  31*  --   --   --   --   --   --   --   --    < > = values in this interval not displayed.    Estimated Creatinine Clearance: 22 mL/min (A) (by C-G formula based on SCr of 3 mg/dL (H)).  Assessment: 79yo male c/o SOB and anorexia, O2 76% on RA on presentation > inc to 91% on 6L, SARS-CoV-2 screen positive, D-dimer found to be >20 >> to start heparin for concern for VTE while awaiting VQ scan.  Heparin level therapeutic (0.43) on 1800 units/hr. No bleeding noted.  Goal of Therapy:  Heparin level 0.3-0.7 units/ml Monitor platelets by anticoagulation protocol: Yes   Plan:  Continue heparin drip at 1800 units/hr  Daily HL, CBC Monitor for s/sx of bleeding  Thank you for involving pharmacy in this patient's care.  Sherlon Handing, PharmD, BCPS Please see amion for complete clinical pharmacist phone list 05/14/2019 11:09 PM

## 2019-05-14 NOTE — Plan of Care (Signed)
  Problem: Education: Goal: Knowledge of risk factors and measures for prevention of condition will improve Outcome: Progressing   Problem: Coping: Goal: Psychosocial and spiritual needs will be supported Outcome: Progressing   Problem: Respiratory: Goal: Will maintain a patent airway Outcome: Progressing Goal: Complications related to the disease process, condition or treatment will be avoided or minimized Outcome: Progressing   Problem: Education: Goal: Ability to describe self-care measures that may prevent or decrease complications (Diabetes Survival Skills Education) will improve Outcome: Progressing Goal: Individualized Educational Video(s) Outcome: Progressing   Problem: Coping: Goal: Ability to adjust to condition or change in health will improve Outcome: Progressing   Problem: Fluid Volume: Goal: Ability to maintain a balanced intake and output will improve Outcome: Progressing   Problem: Health Behavior/Discharge Planning: Goal: Ability to identify and utilize available resources and services will improve Outcome: Progressing Goal: Ability to manage health-related needs will improve Outcome: Progressing   Problem: Metabolic: Goal: Ability to maintain appropriate glucose levels will improve Outcome: Progressing   Problem: Nutritional: Goal: Maintenance of adequate nutrition will improve Outcome: Progressing Goal: Progress toward achieving an optimal weight will improve Outcome: Progressing   Problem: Skin Integrity: Goal: Risk for impaired skin integrity will decrease Outcome: Progressing   Problem: Tissue Perfusion: Goal: Adequacy of tissue perfusion will improve Outcome: Progressing   

## 2019-05-14 NOTE — Progress Notes (Signed)
[facility] for heparin Indication: r/o VTE in setting of Covid infection  No Known Allergies  Patient Measurements: Height: 5\' 11"  (180.3 cm) Weight: 197 lb 5 oz (89.5 kg) IBW/kg (Calculated) : 75.3 Heparin Dosing Weight: 95kg  Vital Signs: Temp: 98.6 F (37 C) (01/23 0830) Temp Source: Axillary (01/23 0830) BP: 125/107 (01/23 1000) Pulse Rate: 99 (01/23 1000)  Labs: Recent Labs    05/13/19 1843 05/13/19 1843 05/13/19 1955 05/13/19 2020 05/13/19 2056 05/14/19 0023 05/14/19 0329 05/14/19 0915  HGB 12.6*   < >  --  14.3  --   --  12.3*  --   HCT 38.5*  --   --  42.0  --   --  36.4*  --   PLT 379  --   --   --   --   --  349  --   HEPARINUNFRC  --   --   --   --   --   --   --  0.24*  CREATININE 4.08*  --   --   --    < > 3.90* 3.65* 3.36*  TROPONINIHS  --   --  31*  --   --   --   --   --    < > = values in this interval not displayed.    Estimated Creatinine Clearance: 19.6 mL/min (A) (by C-G formula based on SCr of 3.36 mg/dL (H)).  Assessment: 79yo male c/o SOB and anorexia, O2 76% on RA on presentation > inc to 91% on 6L, SARS-CoV-2 screen positive, D-dimer found to be >20 >> to start heparin for concern for VTE while awaiting VQ scan.  Heparin level is subtherapeutic at 0.24 on 1600 units/hr. No bleeding noted and no line issues per RN. Hgb 12.3 and likely dilutional, platelets are normal.  Goal of Therapy:  Heparin level 0.3-0.7 units/ml Monitor platelets by anticoagulation protocol: Yes   Plan:  Increase heparin drip to 1800 units/hr  8 hr HL Daily HL, CBC Monitor for s/sx of bleeding  Thank you for involving pharmacy in this patient's care.  Renold Genta, PharmD, BCPS Clinical Pharmacist Clinical phone for 05/14/2019 until 3:30p is [phone] 05/14/2019 11:35 AM  **Pharmacist phone directory can be found on [email or web address] listed under [place]**

## 2019-05-14 NOTE — Progress Notes (Signed)
  Echocardiogram 2D Echocardiogram has been performed.  Ricky Brennan 05/14/2019, 3:43 PM

## 2019-05-14 NOTE — Progress Notes (Signed)
PROGRESS NOTE  Ricky Brennan [id number] DOB: 15-Aug-1940 DOA: 05/13/2019  PCP: Tanda Rockers, MD  Brief History/Interval Summary: 79y.o. male, hypertension, Dm2, h/o prostate cancer, pulmonary nodules (followed by [facility]), anterior mediastinal mass,   presented with dyspnea.  Chest x-ray showed mildly increased lung markings likely chronic in nature.  However patient was noted to be profoundly hypoxic.  He was hospitalized for further management.    Reason for Visit: Acute respiratory failure with hypoxia.  Pneumonia due to COVID-19.  DKA.  Acute renal failure.  Consultants: None  Procedures: None  Antibiotics: Anti-infectives (From admission, onward)   Start     Dose/Rate Route Frequency Ordered Stop   05/14/19 2200  remdesivir 100 mg in sodium chloride 0.9 % 100 mL IVPB     100 mg 200 mL/hr over 30 Minutes Intravenous Daily 05/13/19 2118 05/18/19 2159   05/13/19 2200  remdesivir 200 mg in sodium chloride 0.9% 250 mL IVPB     200 mg 580 mL/hr over 30 Minutes Intravenous Once 05/13/19 2118 05/14/19 0021      Subjective/Interval History: Patient states that he is having difficulty breathing but feels comfortable at this time.  He has been having dry cough.  Denies any chest pain.  Some nausea but no vomiting.  No diarrhea.  Did not take his insulin for 2 days as he was feeling sick.    Assessment/Plan:  Acute Hypoxic Resp. Failure/Pneumonia due to COVID-19   Recent Labs  Lab 05/13/19 1843 05/13/19 1955 05/14/19 0329  DDIMER  --  >20.00*  --   FERRITIN  --  1,188*  --   CRP  --  39.6*  --   ALT 17  --  18  PROCALCITON  --  7.39  --     Objective findings: Fever: Afebrile Oxygen requirements: Currently on HFNC plus nonrebreather saturating in the early 90s.  COVID 19 Therapeutics: Antibacterials: Not initiated yet Remdesivir: Day 2 Steroids: Dexamethasone 6 mg daily Diuretics: None Actemra: Not given yet Convalescent Plasma: 1 unit ordered today PUD  Prophylaxis: Initiate Pepcid DVT Prophylaxis: On IV heparin empirically  Patient's respiratory status is tenuous.  His hypoxia is probably due to COVID-19 however chest x-ray was underwhelming.  He has a significantly elevated D-dimer which increases the likelihood of pulmonary embolus.  Due to renal failure he cannot undergo CT angiogram.  He will need to be transferred to another facility for a VQ scan and he is at this time unstable to be able to do so.  Continue with IV heparin empirically for now.  We will get lower extremity Doppler studies.  His procalcitonin was noted to be elevated at 7.39.  Placed him on antibacterials with ceftriaxone and azithromycin.  CRP significantly elevated at 39.6.  Convalescent plasma was discussed.  He was given the consent form.  He has signed the form.  Plasma has been ordered.  Due to elevated procalcitonin we will hold off on Actemra for now.  Continue with incentive spirometry mobilization prone positioning as much as possible.  Continue to trend inflammatory markers.  Diabetic ketoacidosis in the setting of known insulin-dependent diabetes Patient apparently did not take insulin the last few days as he was feeling sick.  Presented with DKA.  He has been placed on insulin infusion.  DKA appears to have resolved.  His anion gap has closed.  Patient is on Endo tool.  Will wait for Endo tool to recommend transition and once it does we will change him to  subcutaneous insulin.  Check HbA1c.  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  Creatinine on presentation was 4.08.  Most likely due to hypovolemia in the setting of use of ACE inhibitor and diuretics at home.  Patient being hydrated.  Renal function is slowly improving.  Monitor urine output.  Check UA.  Does not show any acute findings.  Left renal cyst noted.  Severe protein calorie malnutrition Continue with  nutritional supplements.  Encourage oral intake.  Essential hypertension Monitor blood pressures closely.  Holding enalapril and hydrochlorothiazide.  History of pulmonary nodules and anterior mediastinal mass Continue with outpatient follow-up for same.  Apparently followed at [facility].  He underwent imaging studies back in August which raised concern for a neoplastic process in the mediastinum.  Possibly thymic origin.  Lymphoma was also thought to be a possibility.   DVT Prophylaxis: On IV heparin Code Status: Full code Family Communication: Discussed with the patient.  Will update her son later today. Disposition Plan: Mobilize as tolerated.  Still very tenuous from a respiratory standpoint.  Hopefully discharge home in improved in a few days.   Medications:  Scheduled: . sodium chloride   Intravenous Once  . dexamethasone (DECADRON) injection  6 mg Intravenous Q24H  . feeding supplement (PRO-STAT SUGAR FREE 64)  30 mL Oral BID   Continuous: . sodium chloride Stopped (05/14/19 0326)  . dextrose 5 % and 0.45% NaCl 50 mL/hr at 05/14/19 0949  . heparin 1,600 Units/hr (05/14/19 0212)  . insulin 3.2 Units/hr (05/14/19 1043)  . remdesivir 100 mg in NS 100 mL     [id number], dextrose   Objective:  Vital Signs  Vitals:   05/14/19 0630 05/14/19 0645 05/14/19 0830 05/14/19 1000  BP: 127/79   (!) 125/107  Pulse: 93 94 94 99  Resp: (!) 25 (!) 26 (!) 24 (!) 22  Temp:   98.6 F (37 C)   TempSrc:   Axillary   SpO2: 90% 90% 92% 91%  Weight:      Height:        Intake/Output Summary (Last 24 hours) at 05/14/2019 1121 Last data filed at 05/14/2019 0949 Gross per 24 hour  Intake 1561.36 ml  Output --  Net 1561.36 ml   Filed Weights   05/14/19 0000 05/14/19 0120  Weight: 99.1 kg 89.5 kg    General appearance: Awake alert.  In no distress Resp: Tachypneic at rest.  Crackles bilaterally.  No wheezing or rhonchi.   Cardio: S1-S2 is normal regular.  No S3-S4.  No rubs  murmurs or bruit GI: Abdomen is soft.  Nontender nondistended.  Bowel sounds are present normal.  No masses organomegaly Extremities: No edema.  Full range of motion of lower extremities. Neurologic: Alert and oriented x3.  No focal neurological deficits.    Lab Results:  Data Reviewed: I have personally reviewed following labs and imaging studies  CBC: Recent Labs  Lab 05/13/19 1843 05/13/19 2020 05/14/19 0329  WBC 13.6*  --  12.4*  NEUTROABS 12.5*  --  11.4*  HGB 12.6* 14.3 12.3*  HCT 38.5* 42.0 36.4*  MCV 86.7  --  83.7  PLT 379  --  0000000    Basic Metabolic Panel: Recent Labs  Lab 05/13/19 1843 05/13/19 1843 05/13/19 2020 05/13/19 2056 05/14/19 0023 05/14/19 0329 05/14/19 0915  NA 126*   < > 129* 131* 134* 138 139  K 4.5   < >  4.7 4.6 4.8 4.2 4.3  CL 92*  --   --  98 100 103 105  CO2 17*  --   --  19* 19* 20* 21*  GLUCOSE 871*  --   --  690* 437* 230* 213*  BUN 93*  --   --  92* 97* 93* 95*  CREATININE 4.08*  --   --  3.90* 3.90* 3.65* 3.36*  CALCIUM 8.2*  --   --  7.9* 8.7* 8.8* 8.6*   < > = values in this interval not displayed.    GFR: Estimated Creatinine Clearance: 19.6 mL/min (A) (by C-G formula based on SCr of 3.36 mg/dL (H)).  Liver Function Tests: Recent Labs  Lab 05/13/19 1843 05/14/19 0329  AST 18 21  ALT 17 18  ALKPHOS 70 64  BILITOT 1.2 0.7  PROT 7.3 7.4  ALBUMIN 2.6* 2.9*    CBG: Recent Labs  Lab 05/14/19 0542 05/14/19 0656 05/14/19 0824 05/14/19 0948 05/14/19 1041  GLUCAP 244* 231* 208* 193* 199*    Lipid Profile: Recent Labs    05/13/19 1945  TRIG 135     Anemia Panel: Recent Labs    05/13/19 1955  FERRITIN 1,188*    Recent Results (from the past 240 hour(s))  Blood Culture (routine x 2)     Status: None (Preliminary result)   Collection Time: 05/13/19  8:03 PM   Specimen: BLOOD LEFT ARM  Result Value Ref Range Status   Specimen Description BLOOD LEFT ARM  Final   Special Requests   Final    BOTTLES DRAWN  AEROBIC AND ANAEROBIC Blood Culture adequate volume   Culture   Final    NO GROWTH < 12 HOURS Performed at [facility], [place] [address] [place], [place] [address]    Report Status PENDING  Incomplete  Blood Culture (routine x 2)     Status: None (Preliminary result)   Collection Time: 05/13/19  8:06 PM   Specimen: BLOOD RIGHT ARM  Result Value Ref Range Status   Specimen Description BLOOD RIGHT ARM  Final   Special Requests   Final    BOTTLES DRAWN AEROBIC AND ANAEROBIC Blood Culture results may not be optimal due to an excessive volume of blood received in culture bottles   Culture   Final    NO GROWTH < 12 HOURS Performed at [facility], [place] [address], [place], [place] [address]    Report Status PENDING  Incomplete      Radiology Studies: US RENAL  Result Date: 05/14/2019 CLINICAL DATA:  COVID positive, hypertension, diabetes, acute renal failure. EXAM: RENAL / URINARY TRACT ULTRASOUND COMPLETE COMPARISON:  None. FINDINGS: Right Kidney: Renal measurements: 11 x 5.2 x 5.4 cm = volume: 163 mL . Echogenicity within normal limits. No mass or hydronephrosis visualized. Left Kidney: Renal measurements: 11.9 x 6.1 x 4.2 cm = volume: 158 mL. Echogenicity within normal limits. Two mildly complex cysts within the LEFT kidney, largest measuring 2.1 cm. No suspicious mass or hydronephrosis visualized. Bladder: Appears normal for degree of bladder distention. Other: None. IMPRESSION: 1. No acute findings.  No hydronephrosis. 2. LEFT renal cysts. Electronically Signed   By: Franki Cabot M.D.   On: 05/14/2019 10:21   DG Chest Portable 1 View  Result Date: 05/13/2019 CLINICAL DATA:  Shortness of breath. EXAM: PORTABLE CHEST 1 VIEW COMPARISON:  April 26, 2016 FINDINGS: Mild diffusely increased interstitial lung markings are seen without evidence of acute infiltrate, pleural effusion or pneumothorax.  The heart size and mediastinal contours are within normal limits. Degenerative changes  seen throughout the thoracic spine. IMPRESSION: 1. Mildly increased interstitial lung markings which are likely chronic in nature. Electronically Signed   By: Virgina Norfolk M.D.   On: 05/13/2019 19:43       LOS: 1 day   [facility] Pager on [email or web address]  05/14/2019, 11:21 AM

## 2019-05-14 NOTE — Progress Notes (Signed)
ANTICOAGULATION CONSULT NOTE - Initial Consult  Pharmacy Consult for heparin Indication: r/o VTE in setting of Covid infection  No Known Allergies  Patient Measurements: Height: 5\' 10"  (177.8 cm) Weight: 218 lb 7.6 oz (99.1 kg) IBW/kg (Calculated) : 73 Heparin Dosing Weight: 95kg  Vital Signs: Temp: 98.3 F (36.8 C) (01/22 1837) Temp Source: Oral (01/22 1837) BP: 125/74 (01/22 2100) Pulse Rate: 96 (01/22 2100)  Labs: Recent Labs    05/13/19 1843 05/13/19 1955 05/13/19 2020 05/13/19 2056  HGB 12.6*  --  14.3  --   HCT 38.5*  --  42.0  --   PLT 379  --   --   --   CREATININE 4.08*  --   --  3.90*  TROPONINIHS  --  31*  --   --     Estimated Creatinine Clearance: 18.7 mL/min (A) (by C-G formula based on SCr of 3.9 mg/dL (H)).   Medical History: Past Medical History:  Diagnosis Date  . Diabetes mellitus without complication ([place])   . Hypertension   . Prostate cancer [facility])     Assessment: 79yo male c/o SOB and anorexia, O2 76% on RA on presentation > inc to 91% on 6L, SARS-CoV-2 screen positive, D-dimer found to be >20 >> to start heparin for concern for VTE while awaiting VQ scan.  Goal of Therapy:  Heparin level 0.3-0.7 units/ml Monitor platelets by anticoagulation protocol: Yes   Plan:  Will give heparin 4000 units IV bolus x1 followed by gtt at 1600 units/hr and monitor heparin levels and CBC.  Wynona Neat, PharmD, BCPS  05/14/2019,12:09 AM

## 2019-05-15 ENCOUNTER — Encounter (HOSPITAL_COMMUNITY): Payer: Medicare HMO

## 2019-05-15 ENCOUNTER — Inpatient Hospital Stay (HOSPITAL_COMMUNITY): Payer: Medicare HMO

## 2019-05-15 LAB — CBC WITH DIFFERENTIAL/PLATELET
Abs Immature Granulocytes: 0.29 10*3/uL — ABNORMAL HIGH (ref 0.00–0.07)
Basophils Absolute: 0 10*3/uL (ref 0.0–0.1)
Basophils Relative: 0 %
Eosinophils Absolute: 0 10*3/uL (ref 0.0–0.5)
Eosinophils Relative: 0 %
HCT: 35.1 % — ABNORMAL LOW (ref 39.0–52.0)
Hemoglobin: 11.9 g/dL — ABNORMAL LOW (ref 13.0–17.0)
Immature Granulocytes: 2 %
Lymphocytes Relative: 3 %
Lymphs Abs: 0.3 10*3/uL — ABNORMAL LOW (ref 0.7–4.0)
MCH: 28 pg (ref 26.0–34.0)
MCHC: 33.9 g/dL (ref 30.0–36.0)
MCV: 82.6 fL (ref 80.0–100.0)
Monocytes Absolute: 0.4 10*3/uL (ref 0.1–1.0)
Monocytes Relative: 3 %
Neutro Abs: 12 10*3/uL — ABNORMAL HIGH (ref 1.7–7.7)
Neutrophils Relative %: 92 %
Platelets: 378 10*3/uL (ref 150–400)
RBC: 4.25 MIL/uL (ref 4.22–5.81)
RDW: 13.2 % (ref 11.5–15.5)
WBC: 13 10*3/uL — ABNORMAL HIGH (ref 4.0–10.5)
nRBC: 0 % (ref 0.0–0.2)

## 2019-05-15 LAB — D-DIMER, QUANTITATIVE: D-Dimer, Quant: >20 ug/mL-FEU — ABNORMAL HIGH (ref 0.00–0.50)

## 2019-05-15 LAB — BPAM FFP
Blood Product Expiration Date: 202101241508
ISSUE DATE / TIME: 202101231511
Unit Type and Rh: 7300

## 2019-05-15 LAB — GLUCOSE, CAPILLARY
Glucose-Capillary: 274 mg/dL — ABNORMAL HIGH (ref 70–99)
Glucose-Capillary: 202 mg/dL — ABNORMAL HIGH (ref 70–99)
Glucose-Capillary: 239 mg/dL — ABNORMAL HIGH (ref 70–99)
Glucose-Capillary: 251 mg/dL — ABNORMAL HIGH (ref 70–99)
Glucose-Capillary: 215 mg/dL — ABNORMAL HIGH (ref 70–99)
Glucose-Capillary: 208 mg/dL — ABNORMAL HIGH (ref 70–99)
Glucose-Capillary: 189 mg/dL — ABNORMAL HIGH (ref 70–99)
Glucose-Capillary: 189 mg/dL — ABNORMAL HIGH (ref 70–99)
Glucose-Capillary: 178 mg/dL — ABNORMAL HIGH (ref 70–99)
Glucose-Capillary: 172 mg/dL — ABNORMAL HIGH (ref 70–99)
Glucose-Capillary: 194 mg/dL — ABNORMAL HIGH (ref 70–99)
Glucose-Capillary: 238 mg/dL — ABNORMAL HIGH (ref 70–99)
Glucose-Capillary: 466 mg/dL — ABNORMAL HIGH (ref 70–99)
Glucose-Capillary: 420 mg/dL — ABNORMAL HIGH (ref 70–99)
Glucose-Capillary: 388 mg/dL — ABNORMAL HIGH (ref 70–99)

## 2019-05-15 LAB — COMPREHENSIVE METABOLIC PANEL
ALT: 28 U/L (ref 0–44)
AST: 43 U/L — ABNORMAL HIGH (ref 15–41)
Albumin: 2.5 g/dL — ABNORMAL LOW (ref 3.5–5.0)
Alkaline Phosphatase: 58 U/L (ref 38–126)
Anion gap: 13 (ref 5–15)
BUN: 93 mg/dL — ABNORMAL HIGH (ref 8–23)
CO2: 21 mmol/L — ABNORMAL LOW (ref 22–32)
Calcium: 8.7 mg/dL — ABNORMAL LOW (ref 8.9–10.3)
Chloride: 108 mmol/L (ref 98–111)
Creatinine, Ser: 2.69 mg/dL — ABNORMAL HIGH (ref 0.61–1.24)
GFR calc Af Amer: 25 mL/min — ABNORMAL LOW (ref >60)
GFR calc non Af Amer: 22 mL/min — ABNORMAL LOW (ref >60)
Glucose, Bld: 247 mg/dL — ABNORMAL HIGH (ref 70–99)
Potassium: 3.5 mmol/L (ref 3.5–5.1)
Sodium: 142 mmol/L (ref 135–145)
Total Bilirubin: 0.4 mg/dL (ref 0.3–1.2)
Total Protein: 6.9 g/dL (ref 6.5–8.1)

## 2019-05-15 LAB — PREPARE FRESH FROZEN PLASMA: Unit division: 0

## 2019-05-15 LAB — HEMOGLOBIN A1C
Hgb A1c MFr Bld: 9.2 % — ABNORMAL HIGH (ref 4.8–5.6)
Mean Plasma Glucose: 217.34 mg/dL

## 2019-05-15 LAB — C-REACTIVE PROTEIN: CRP: 16.5 mg/dL — ABNORMAL HIGH (ref <1.0)

## 2019-05-15 LAB — MRSA PCR SCREENING: MRSA by PCR: NEGATIVE

## 2019-05-15 LAB — HEPARIN LEVEL (UNFRACTIONATED): Heparin Unfractionated: 0.5 IU/mL (ref 0.30–0.70)

## 2019-05-15 MED ORDER — SODIUM CHLORIDE 0.45 % IV SOLN: INTRAVENOUS | Status: DC

## 2019-05-15 MED ORDER — INSULIN ASPART 100 UNIT/ML ~~LOC~~ SOLN
0.0000 [IU] | Freq: Three times a day (TID) | SUBCUTANEOUS | Status: DC
  Administered 2019-05-15: 5 [IU] via SUBCUTANEOUS
  Administered 2019-05-16: 15 [IU] via SUBCUTANEOUS
  Administered 2019-05-16: 5 [IU] via SUBCUTANEOUS
  Administered 2019-05-17: 11 [IU] via SUBCUTANEOUS
  Administered 2019-05-17: 5 [IU] via SUBCUTANEOUS
  Administered 2019-05-17 – 2019-05-18 (×2): 11 [IU] via SUBCUTANEOUS
  Administered 2019-05-18: 15 [IU] via SUBCUTANEOUS
  Administered 2019-05-19: 3 [IU] via SUBCUTANEOUS
  Administered 2019-05-19: 5 [IU] via SUBCUTANEOUS
  Administered 2019-05-20 (×2): 11 [IU] via SUBCUTANEOUS

## 2019-05-15 MED ORDER — INSULIN ASPART 100 UNIT/ML ~~LOC~~ SOLN
0.0000 [IU] | Freq: Every day | SUBCUTANEOUS | Status: DC
  Administered 2019-05-15: 5 [IU] via SUBCUTANEOUS
  Administered 2019-05-16 – 2019-05-17 (×2): 4 [IU] via SUBCUTANEOUS
  Administered 2019-05-18: 5 [IU] via SUBCUTANEOUS
  Administered 2019-05-19: 2 [IU] via SUBCUTANEOUS

## 2019-05-15 MED ORDER — DOCUSATE SODIUM 100 MG PO CAPS
100.0000 mg | ORAL_CAPSULE | Freq: Two times a day (BID) | ORAL | Status: DC
  Administered 2019-05-15 – 2019-05-22 (×14): 100 mg via ORAL
  Filled 2019-05-15 (×14): qty 1

## 2019-05-15 MED ORDER — INSULIN ASPART 100 UNIT/ML ~~LOC~~ SOLN
25.0000 [IU] | Freq: Once | SUBCUTANEOUS | Status: AC
  Administered 2019-05-15: 25 [IU] via SUBCUTANEOUS

## 2019-05-15 MED ORDER — INSULIN DETEMIR 100 UNIT/ML ~~LOC~~ SOLN
0.3000 [IU]/kg | SUBCUTANEOUS | Status: DC
  Administered 2019-05-15: 27 [IU] via SUBCUTANEOUS
  Filled 2019-05-15 (×2): qty 0.27

## 2019-05-15 MED ORDER — FAMOTIDINE 20 MG PO TABS
20.0000 mg | ORAL_TABLET | Freq: Every day | ORAL | Status: DC
  Administered 2019-05-15 – 2019-05-22 (×8): 20 mg via ORAL
  Filled 2019-05-15 (×8): qty 1

## 2019-05-15 MED ORDER — POLYETHYLENE GLYCOL 3350 17 G PO PACK
17.0000 g | PACK | Freq: Every day | ORAL | Status: DC | PRN
  Administered 2019-05-18 – 2019-05-20 (×3): 17 g via ORAL
  Filled 2019-05-15 (×3): qty 1

## 2019-05-15 MED ORDER — INSULIN DETEMIR 100 UNIT/ML ~~LOC~~ SOLN
15.0000 [IU] | Freq: Once | SUBCUTANEOUS | Status: AC
  Administered 2019-05-15: 15 [IU] via SUBCUTANEOUS
  Filled 2019-05-15: qty 0.15

## 2019-05-15 NOTE — Progress Notes (Signed)
[place] for heparin Indication: r/o VTE in setting of Covid infection  No Known Allergies  Patient Measurements: Height: 5\' 11"  (180.3 cm) Weight: 197 lb 5 oz (89.5 kg) IBW/kg (Calculated) : 75.3 Heparin Dosing Weight: 95kg  Vital Signs: Temp: 97.7 F (36.5 C) (01/24 0532) Temp Source: Axillary (01/24 0532) BP: 118/72 (01/24 0325) Pulse Rate: 91 (01/24 0532)  Labs: Recent Labs    05/13/19 1843 05/13/19 1843 05/13/19 1955 05/13/19 2020 05/13/19 2056 05/14/19 0329 05/14/19 0329 05/14/19 0915 05/14/19 0915 05/14/19 1150 05/14/19 1830 05/14/19 2053 05/15/19 0245  HGB 12.6*   < >  --  14.3   < > 12.3*  --   --   --   --   --   --  11.9*  HCT 38.5*   < >  --  42.0  --  36.4*  --   --   --   --   --   --  35.1*  PLT 379  --   --   --   --  349  --   --   --   --   --   --  378  HEPARINUNFRC  --   --   --   --   --   --   --  0.24*  --   --   --  0.43 0.50  CREATININE 4.08*  --   --   --    < > 3.65*   < > 3.36*   < > 3.41* 3.00*  --  2.69*  TROPONINIHS  --   --  31*  --   --   --   --   --   --   --   --   --   --    < > = values in this interval not displayed.    Estimated Creatinine Clearance: 24.5 mL/min (A) (by C-G formula based on SCr of 2.69 mg/dL (H)).  Assessment: 79yo male c/o SOB and anorexia, O2 76% on RA on presentation > inc to 91% on 6L, SARS-CoV-2 screen positive, D-dimer found to be >20 >> to start heparin for concern for VTE while awaiting VQ scan.  Heparin level therapeutic (0.5) on 1800 units/hr. No bleeding noted.  Goal of Therapy:  Heparin level 0.3-0.7 units/ml Monitor platelets by anticoagulation protocol: Yes   Plan:  Continue heparin drip at 1800 units/hr  Daily HL, CBC Monitor for s/sx of bleeding  Thank you for involving pharmacy in this patient's care.  Sherlon Handing, PharmD, BCPS Please see amion for complete clinical pharmacist phone list 05/15/2019 6:55 AM

## 2019-05-15 NOTE — Progress Notes (Signed)
PROGRESS NOTE  Ricky Brennan [id number] DOB: [birth year] DOA: 05/13/2019  PCP: Tanda Rockers, MD  Brief History/Interval Summary: 79y.o. male, hypertension, Dm2, h/o prostate cancer, pulmonary nodules (followed by [facility]), anterior mediastinal mass,   presented with dyspnea.  Chest x-ray showed mildly increased lung markings likely chronic in nature.  However patient was noted to be profoundly hypoxic.  He was hospitalized for further management.    Reason for Visit: Acute respiratory failure with hypoxia.  Pneumonia due to COVID-19.  DKA.  Acute renal failure.  Consultants: None  Procedures: None  Antibiotics: Anti-infectives (From admission, onward)   Start     Dose/Rate Route Frequency Ordered Stop   05/14/19 2200  remdesivir 100 mg in sodium chloride 0.9 % 100 mL IVPB     100 mg 200 mL/hr over 30 Minutes Intravenous Daily 05/13/19 2118 05/18/19 2159   05/14/19 1200  cefTRIAXone (ROCEPHIN) 1 g in sodium chloride 0.9 % 100 mL IVPB     1 g 200 mL/hr over 30 Minutes Intravenous Every 24 hours 05/14/19 1142 05/19/19 1159   05/14/19 1145  azithromycin (ZITHROMAX) tablet 500 mg     500 mg Oral Daily 05/14/19 1142 05/19/19 0959   05/13/19 2200  remdesivir 200 mg in sodium chloride 0.9% 250 mL IVPB     200 mg 580 mL/hr over 30 Minutes Intravenous Once 05/13/19 2118 05/14/19 0021      Subjective/Interval History: Patient states that he is feeling slightly better though still short of breath.  Still has dry cough.  No chest pain.  No nausea or vomiting.  No diarrhea.  Wondering when he can go home.    Assessment/Plan:  Acute Hypoxic Resp. Failure/Pneumonia due to COVID-19   Recent Labs  Lab 05/13/19 1843 05/13/19 1955 05/14/19 0329 05/14/19 1830 05/15/19 0245  DDIMER  --  >20.00*  --   --  >20.00*  FERRITIN  --  1,188*  --   --   --   CRP  --  39.6*  --   --  16.5*  ALT 17  --  18 26 28   PROCALCITON  --  7.39  --   --   --     Objective findings: Fever: Remains  afebrile Oxygen requirements: HFNC 8 L/min.  Saturating in the early 90s.    COVID 19 Therapeutics: Antibacterials: Ceftriaxone and azithromycin initiated on 1/23 Remdesivir: Day 3 Steroids: Dexamethasone 6 mg daily Diuretics: None Actemra: Not given yet Convalescent Plasma: Transfused 1 unit on 1/23 PUD Prophylaxis: Pepcid DVT Prophylaxis: On IV heparin empirically  Patient's respiratory status appears to be improving.  He is still requiring high flow nasal cannula but his oxygen requirements have decreased.  His CRP is improved to 16.5.  D-dimer remains significantly elevated.  His procalcitonin was also elevated at 7.39.  He remains on remdesivir and steroids.  He is also on antibacterials.  Could not undergo CT angiogram due to elevated creatinine.  Could not go for VQ scan due to tenuous respiratory status.  Lower extremity Doppler studies pending.  Convalescent plasma was transfused yesterday.  Chest x-ray done this morning shows slight progression of disease.  However clinically he has improved.  Continue incentive spirometry, mobilization, prone positioning as much as possible.  Diabetic ketoacidosis in the setting of known insulin-dependent diabetes Patient apparently did not take insulin for a few days at home as he was feeling sick.  He presented with DKA.  Placed on insulin infusion.  Anion gap has closed.  Transition him to subcutaneous insulin today.  HBA1c 9.2.   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  Creatinine on presentation was 4.08.  Most likely due to hypovolemia in the setting of use of ACE inhibitor and diuretics at home.  Patient was hydrated.  Renal function has improved.  Creatinine is down to 2.69.  Monitor urine output.  Continue gentle IV hydration.  Renal ultrasound does not show any acute findings.  Left renal cyst noted.  UA shows large hemoglobin with only 0-5 RBCs.   Check a CK level.  Severe protein calorie malnutrition Continue with nutritional supplements.  Encourage oral intake.  Essential hypertension Blood pressure is reasonably well controlled.  Continue to monitor.  Holding enalapril and hydrochlorothiazide.  Echocardiogram shows normal systolic function.  Grade 1 diastolic dysfunction.  LVH is noted.  History of pulmonary nodules and anterior mediastinal mass Continue with outpatient follow-up for same.  Apparently followed at [facility].  He underwent imaging studies back in August which raised concern for a neoplastic process in the mediastinum.  Possibly thymic origin.  Lymphoma was also thought to be a possibility.   DVT Prophylaxis: On IV heparin Code Status: Full code Family Communication: Patient's son was updated yesterday.  Will do so again today. Disposition Plan: Still tenuous from a respiratory standpoint.  Mobilize.  Hopefully home when improved.     Medications:  Scheduled:  azithromycin  500 mg Oral Daily   dexamethasone (DECADRON) injection  6 mg Intravenous Q24H   docusate sodium  100 mg Oral BID   feeding supplement (PRO-STAT SUGAR FREE 64)  30 mL Oral BID   insulin aspart  0-15 Units Subcutaneous TID WC   insulin aspart  0-5 Units Subcutaneous QHS   insulin detemir  0.3 Units/kg Subcutaneous Q24H   Continuous:  sodium chloride Stopped (05/14/19 0326)   cefTRIAXone (ROCEPHIN)  IV 1 g (05/15/19 1138)   heparin 1,800 Units/hr (05/15/19 [id number])   remdesivir 100 mg in NS 100 mL Stopped (05/14/19 2252)   [id number], dextrose, polyethylene glycol   Objective:  Vital Signs  Vitals:   05/15/19 0325 05/15/19 0532 05/15/19 0720 05/15/19 0800  BP: 118/72   125/70  Pulse: 80 91 82 86  Resp: (!) 24 (!) 23  (!) 24  Temp: 97.7 F (36.5 C) 97.7 F (36.5 C)  (!) 97.4 F (36.3 C)  TempSrc: Axillary Axillary  Oral  SpO2: 97% 95%  100%  Weight:      Height:        Intake/Output Summary (Last 24 hours) at  05/15/2019 1148 Last data filed at 05/15/2019 0630 Gross per 24 hour  Intake 2281.11 ml  Output 750 ml  Net 1531.11 ml   Filed Weights   05/14/19 0000 05/14/19 0120  Weight: 99.1 kg 89.5 kg    General appearance: Awake alert.  In no distress Resp: Tachypneic.  No use of accessory muscles.  Crackles bilaterally.  No wheezing or rhonchi. Cardio: S1-S2 is normal regular.  No S3-S4.  No rubs murmurs or bruit GI: Abdomen is soft.  Nontender nondistended.  Bowel sounds are present normal.  No masses organomegaly Extremities: No edema.  Full range of motion of lower extremities. Neurologic: Alert and oriented x3.  No focal neurological deficits.      Lab Results:  Data Reviewed: I have personally reviewed following labs and imaging studies  CBC: Recent Labs  Lab 05/13/19 1843 05/13/19  2020 05/14/19 0329 05/15/19 0245  WBC 13.6*  --  12.4* 13.0*  NEUTROABS 12.5*  --  11.4* 12.0*  HGB 12.6* 14.3 12.3* 11.9*  HCT 38.5* 42.0 36.4* 35.1*  MCV 86.7  --  83.7 82.6  PLT 379  --  349 [id number]    Basic Metabolic Panel: Recent Labs  Lab 05/14/19 0329 05/14/19 0915 05/14/19 1150 05/14/19 1830 05/15/19 0245  NA 138 139 140 140 142  K 4.2 4.3 4.0 4.0 3.5  CL 103 105 104 107 108  CO2 20* 21* 20* 20* 21*  GLUCOSE 230* 213* 210* 218* 247*  BUN 93* 95* 98* 89* 93*  CREATININE 3.65* 3.36* 3.41* 3.00* 2.69*  CALCIUM 8.8* 8.6* 8.7* 8.9 8.7*    GFR: Estimated Creatinine Clearance: 24.5 mL/min (A) (by C-G formula based on SCr of 2.69 mg/dL (H)).  Liver Function Tests: Recent Labs  Lab 05/13/19 1843 05/14/19 0329 05/14/19 1830 05/15/19 0245  AST 18 21 53* 43*  ALT 17 18 26 28   ALKPHOS 70 64 65 58  BILITOT 1.2 0.7 0.8 0.4  PROT 7.3 7.4 7.5 6.9  ALBUMIN 2.6* 2.9* 2.8* 2.5*    CBG: Recent Labs  Lab 05/15/19 0223 05/15/19 0325 05/15/19 0426 05/15/19 0527 05/15/19 0629  GLUCAP 251* 215* 208* 189* 189*    Lipid Profile: Recent Labs    05/13/19 1945  TRIG 135      Anemia Panel: Recent Labs    05/13/19 1955  FERRITIN 1,188*    Recent Results (from the past 240 hour(s))  Blood Culture (routine x 2)     Status: None (Preliminary result)   Collection Time: 05/13/19  8:03 PM   Specimen: BLOOD LEFT ARM  Result Value Ref Range Status   Specimen Description BLOOD LEFT ARM  Final   Special Requests   Final    BOTTLES DRAWN AEROBIC AND ANAEROBIC Blood Culture adequate volume   Culture   Final    NO GROWTH 2 DAYS Performed at [facility], [address], [place], [place] [address]    Report Status PENDING  Incomplete  Blood Culture (routine x 2)     Status: None (Preliminary result)   Collection Time: 05/13/19  8:06 PM   Specimen: BLOOD RIGHT ARM  Result Value Ref Range Status   Specimen Description BLOOD RIGHT ARM  Final   Special Requests   Final    BOTTLES DRAWN AEROBIC AND ANAEROBIC Blood Culture results may not be optimal due to an excessive volume of blood received in culture bottles   Culture   Final    NO GROWTH 2 DAYS Performed at [facility], [place] [address], [place],  [address]    Report Status PENDING  Incomplete      Radiology Studies: US RENAL  Result Date: 05/14/2019 CLINICAL DATA:  COVID positive, hypertension, diabetes, acute renal failure. EXAM: RENAL / URINARY TRACT ULTRASOUND COMPLETE COMPARISON:  None. FINDINGS: Right Kidney: Renal measurements: 11 x 5.2 x 5.4 cm = volume: 163 mL . Echogenicity within normal limits. No mass or hydronephrosis visualized. Left Kidney: Renal measurements: 11.9 x 6.1 x 4.2 cm = volume: 158 mL. Echogenicity within normal limits. Two mildly complex cysts within the LEFT kidney, largest measuring 2.1 cm. No suspicious mass or hydronephrosis visualized. Bladder: Appears normal for degree of bladder distention. Other: None. IMPRESSION: 1. No acute findings.  No hydronephrosis. 2. LEFT renal cysts. Electronically Signed   By: Franki Cabot M.D.   On: 05/14/2019 10:21  DG  Chest Port 1 View  Result Date: 05/15/2019 CLINICAL DATA:  Pneumonia due to COVID-19 virus. EXAM: PORTABLE CHEST 1 VIEW COMPARISON:  05/13/2019 and 04/26/2016 FINDINGS: Slightly increased interstitial densities in both lungs. Heart and mediastinum are within normal limits and stable. Trachea is midline. Negative for a pneumothorax. Bone structures are unremarkable. Densities and/or vascular crowding at the medial right lung base. IMPRESSION: Slightly increased interstitial lung densities bilaterally. Findings are compatible with pneumonia due to COVID-19. Electronically Signed   By: Markus Daft M.D.   On: 05/15/2019 08:22   DG Chest Portable 1 View  Result Date: 05/13/2019 CLINICAL DATA:  Shortness of breath. EXAM: PORTABLE CHEST 1 VIEW COMPARISON:  April 26, 2016 FINDINGS: Mild diffusely increased interstitial lung markings are seen without evidence of acute infiltrate, pleural effusion or pneumothorax. The heart size and mediastinal contours are within normal limits. Degenerative changes seen throughout the thoracic spine. IMPRESSION: 1. Mildly increased interstitial lung markings which are likely chronic in nature. Electronically Signed   By: Virgina Norfolk M.D.   On: 05/13/2019 19:43   ECHOCARDIOGRAM COMPLETE  Result Date: 05/14/2019   ECHOCARDIOGRAM REPORT   Patient Name:   SHOJI DIZE Date of Exam: 05/14/2019 Medical Rec #:  [id number]        Height:       71.0 in Accession #:    [id number]       Weight:       197.3 lb Date of Birth:  [birth year]        BSA:          2.10 m Patient Age:    70 years         BP:           121/77 mmHg Patient Gender: M                HR:           89 bpm. Exam Location:  Inpatient Procedure: 2D Echo Indications:    Dyspnea 786.09 / R06.00  History:        Patient has no prior history of Echocardiogram examinations.                 Risk Factors:Former Smoker, Hypertension and Diabetes. COVID-19.  Sonographer:    Clayton Lefort RDCS (AE) Referring Phys: [id number] [facility]  Sonographer Comments: Suboptimal subcostal window. Bright ambient room light. Ecocardiogram completed at [facility]. COVID-19. IMPRESSIONS  1. Left ventricular ejection fraction, by visual estimation, is 60 to 65%. The left ventricle has normal function. There is severely increased left ventricular hypertrophy.  2. Elevated left atrial pressure.  3. Left ventricular diastolic parameters are consistent with Grade I diastolic dysfunction (impaired relaxation).  4. The left ventricle has no regional wall motion abnormalities.  5. Global right ventricle has normal systolic function.The right ventricular size is normal.  6. Left atrial size was normal.  7. Right atrial size was normal.  8. The mitral valve is normal in structure. Trivial mitral valve regurgitation. No evidence of mitral stenosis.  9. The tricuspid valve is normal in structure. Tricuspid valve regurgitation is mild. 10. The aortic valve is tricuspid. Aortic valve regurgitation is mild. Mild aortic valve sclerosis without stenosis. 11. The pulmonic valve was normal in structure. Pulmonic valve regurgitation is not visualized. 12. Moderately elevated pulmonary artery systolic pressure. 13. The inferior vena cava is normal in size with greater than 50% respiratory variability, suggesting right atrial pressure of 3  mmHg. 14. Normal LV systolic function; grade 1 diastolic dysfunction; severe LVH; mild AI and TR; moderate pulmonary hypertension. FINDINGS  Left Ventricle: Left ventricular ejection fraction, by visual estimation, is 60 to 65%. The left ventricle has normal function. The left ventricle has no regional wall motion abnormalities. There is severely increased left ventricular hypertrophy. Left ventricular diastolic parameters are consistent with Grade I diastolic dysfunction (impaired relaxation). Elevated left atrial pressure. Right Ventricle: The right ventricular size is normal.Global RV systolic function is has normal  systolic function. The tricuspid regurgitant velocity is 3.23 m/s, and with an assumed right atrial pressure of 3 mmHg, the estimated right ventricular systolic pressure is moderately elevated at 44.7 mmHg. Left Atrium: Left atrial size was normal in size. Right Atrium: Right atrial size was normal in size Pericardium: There is no evidence of pericardial effusion. Mitral Valve: The mitral valve is normal in structure. Trivial mitral valve regurgitation. No evidence of mitral valve stenosis by observation. Tricuspid Valve: The tricuspid valve is normal in structure. Tricuspid valve regurgitation is mild. Aortic Valve: The aortic valve is tricuspid. Aortic valve regurgitation is mild. Aortic regurgitation PHT measures 590 msec. Mild aortic valve sclerosis is present, with no evidence of aortic valve stenosis. Aortic valve mean gradient measures 3.0 mmHg. Aortic valve peak gradient measures 4.4 mmHg. Aortic valve area, by VTI measures 2.56 cm. Pulmonic Valve: The pulmonic valve was normal in structure. Pulmonic valve regurgitation is not visualized. Pulmonic regurgitation is not visualized. Aorta: The aortic root is normal in size and structure. Venous: The inferior vena cava is normal in size with greater than 50% respiratory variability, suggesting right atrial pressure of 3 mmHg. IAS/Shunts: No atrial level shunt detected by color flow Doppler. Additional Comments: Normal LV systolic function; grade 1 diastolic dysfunction; severe LVH; mild AI and TR; moderate pulmonary hypertension.  LEFT VENTRICLE PLAX 2D LVIDd:         3.98 cm  Diastology LVIDs:         2.86 cm  LV e' lateral:   5.77 cm/s LV PW:         1.58 cm  LV E/e' lateral: 10.5 LV IVS:        1.73 cm  LV e' medial:    3.92 cm/s LVOT diam:     2.00 cm  LV E/e' medial:  15.5 LV SV:         38 ml LV SV Index:   17.84 LVOT Area:     3.14 cm  RIGHT VENTRICLE             IVC RV Basal diam:  2.76 cm     IVC diam: 1.01 cm RV S prime:     11.50 cm/s TAPSE  (M-mode): 1.9 cm LEFT ATRIUM             Index       RIGHT ATRIUM           Index LA diam:        2.70 cm 1.29 cm/m  RA Area:     12.30 cm LA Vol (A2C):   40.2 ml 19.18 ml/m RA Volume:   25.70 ml  12.26 ml/m LA Vol (A4C):   38.0 ml 18.13 ml/m LA Biplane Vol: 41.4 ml 19.75 ml/m  AORTIC VALVE AV Area (Vmax):    2.24 cm AV Area (Vmean):   2.09 cm AV Area (VTI):     2.56 cm AV Vmax:  105.00 cm/s AV Vmean:          80.200 cm/s AV VTI:            0.200 m AV Peak Grad:      4.4 mmHg AV Mean Grad:      3.0 mmHg LVOT Vmax:         74.80 cm/s LVOT Vmean:        53.400 cm/s LVOT VTI:          0.163 m LVOT/AV VTI ratio: 0.82 AI PHT:            590 msec  AORTA Ao Root diam: 3.40 cm MITRAL VALVE                        TRICUSPID VALVE MV Area (PHT): 3.77 cm             TR Peak grad:   41.7 mmHg MV PHT:        58.29 msec           TR Vmax:        323.00 cm/s MV Decel Time: 201 msec MV E velocity: 60.70 cm/s 103 cm/s  SHUNTS MV A velocity: 63.10 cm/s 70.3 cm/s Systemic VTI:  0.16 m MV E/A ratio:  0.96       1.5       Systemic Diam: 2.00 cm  Kirk Ruths MD Electronically signed by Kirk Ruths MD Signature Date/Time: 05/14/2019/3:54:04 PM    Final        LOS: 2 days   [facility] Pager on [email or web address]  05/15/2019, 11:48 AM

## 2019-05-15 NOTE — Progress Notes (Addendum)
Dr. Maryland Pink was notified that patient's CBG at 1557 was 458. Orders will be placed for novolog and levemir. RN will re-check CBG 1 hour after administration of insulin. No lab draws were ordered at this time.

## 2019-05-15 NOTE — Progress Notes (Signed)
UPDATE  Lab called regarding need for labs to be drawn

## 2019-05-15 NOTE — Progress Notes (Addendum)
CBG at 1753 was 466. Dr. Maryland Pink was notified of CBG recheck after giving 25 units of Novolog and 15 units of Levemir.   New orders for another 25 units of subcutaneous Novolog given. Recheck CBG an hour after administration. No blood draw ordered at this time. Pt still asymptomatic.

## 2019-05-15 NOTE — Plan of Care (Signed)
Pt up to chair this morning. Aox4. Currently on 8L HFNC. Insulin drip was discontinued today and clear liquids diet started. No complaints of pain or shortness of breath. Desats to mid 76s when ambulating. Son, Garnet Koyanagi, was updated this morning.    Problem: Education: Goal: Knowledge of risk factors and measures for prevention of condition will improve Outcome: Progressing   Problem: Coping: Goal: Psychosocial and spiritual needs will be supported Outcome: Progressing   Problem: Respiratory: Goal: Will maintain a patent airway Outcome: Progressing Goal: Complications related to the disease process, condition or treatment will be avoided or minimized Outcome: Progressing   Problem: Education: Goal: Ability to describe self-care measures that may prevent or decrease complications (Diabetes Survival Skills Education) will improve Outcome: Progressing Goal: Individualized Educational Video(s) Outcome: Progressing   Problem: Coping: Goal: Ability to adjust to condition or change in health will improve Outcome: Progressing   Problem: Fluid Volume: Goal: Ability to maintain a balanced intake and output will improve Outcome: Progressing   Problem: Health Behavior/Discharge Planning: Goal: Ability to identify and utilize available resources and services will improve Outcome: Progressing Goal: Ability to manage health-related needs will improve Outcome: Progressing   Problem: Metabolic: Goal: Ability to maintain appropriate glucose levels will improve Outcome: Progressing   Problem: Nutritional: Goal: Maintenance of adequate nutrition will improve Outcome: Progressing Goal: Progress toward achieving an optimal weight will improve Outcome: Progressing   Problem: Skin Integrity: Goal: Risk for impaired skin integrity will decrease Outcome: Progressing   Problem: Tissue Perfusion: Goal: Adequacy of tissue perfusion will improve Outcome: Progressing

## 2019-05-16 ENCOUNTER — Inpatient Hospital Stay (HOSPITAL_COMMUNITY): Payer: Medicare HMO

## 2019-05-16 DIAGNOSIS — R7989 Other specified abnormal findings of blood chemistry: Secondary | ICD-10-CM

## 2019-05-16 DIAGNOSIS — M7989 Other specified soft tissue disorders: Secondary | ICD-10-CM

## 2019-05-16 DIAGNOSIS — U071 COVID-19: Secondary | ICD-10-CM

## 2019-05-16 LAB — CBC WITH DIFFERENTIAL/PLATELET
Abs Immature Granulocytes: 0.43 10*3/uL — ABNORMAL HIGH (ref 0.00–0.07)
Basophils Absolute: 0.1 10*3/uL (ref 0.0–0.1)
Basophils Relative: 1 %
Eosinophils Absolute: 0 10*3/uL (ref 0.0–0.5)
Eosinophils Relative: 0 %
HCT: 38.5 % — ABNORMAL LOW (ref 39.0–52.0)
Hemoglobin: 12.8 g/dL — ABNORMAL LOW (ref 13.0–17.0)
Immature Granulocytes: 3 %
Lymphocytes Relative: 2 %
Lymphs Abs: 0.3 10*3/uL — ABNORMAL LOW (ref 0.7–4.0)
MCH: 27.8 pg (ref 26.0–34.0)
MCHC: 33.2 g/dL (ref 30.0–36.0)
MCV: 83.5 fL (ref 80.0–100.0)
Monocytes Absolute: 0.7 10*3/uL (ref 0.1–1.0)
Monocytes Relative: 5 %
Neutro Abs: 12.3 10*3/uL — ABNORMAL HIGH (ref 1.7–7.7)
Neutrophils Relative %: 89 %
Platelets: 437 10*3/uL — ABNORMAL HIGH (ref 150–400)
RBC: 4.61 MIL/uL (ref 4.22–5.81)
RDW: 13.7 % (ref 11.5–15.5)
WBC: 13.8 10*3/uL — ABNORMAL HIGH (ref 4.0–10.5)
nRBC: 0.1 % (ref 0.0–0.2)

## 2019-05-16 LAB — GLUCOSE, CAPILLARY
Glucose-Capillary: 247 mg/dL — ABNORMAL HIGH (ref 70–99)
Glucose-Capillary: 257 mg/dL — ABNORMAL HIGH (ref 70–99)
Glucose-Capillary: 373 mg/dL — ABNORMAL HIGH (ref 70–99)
Glucose-Capillary: 466 mg/dL — ABNORMAL HIGH (ref 70–99)
Glucose-Capillary: 345 mg/dL — ABNORMAL HIGH (ref 70–99)

## 2019-05-16 LAB — COMPREHENSIVE METABOLIC PANEL
ALT: 28 U/L (ref 0–44)
AST: 29 U/L (ref 15–41)
Albumin: 2.7 g/dL — ABNORMAL LOW (ref 3.5–5.0)
Alkaline Phosphatase: 72 U/L (ref 38–126)
Anion gap: 14 (ref 5–15)
BUN: 91 mg/dL — ABNORMAL HIGH (ref 8–23)
CO2: 21 mmol/L — ABNORMAL LOW (ref 22–32)
Calcium: 8.6 mg/dL — ABNORMAL LOW (ref 8.9–10.3)
Chloride: 108 mmol/L (ref 98–111)
Creatinine, Ser: 2.12 mg/dL — ABNORMAL HIGH (ref 0.61–1.24)
GFR calc Af Amer: 34 mL/min — ABNORMAL LOW (ref >60)
GFR calc non Af Amer: 29 mL/min — ABNORMAL LOW (ref >60)
Glucose, Bld: 237 mg/dL — ABNORMAL HIGH (ref 70–99)
Potassium: 3.5 mmol/L (ref 3.5–5.1)
Sodium: 143 mmol/L (ref 135–145)
Total Bilirubin: 0.6 mg/dL (ref 0.3–1.2)
Total Protein: 7.2 g/dL (ref 6.5–8.1)

## 2019-05-16 LAB — PROTEIN ELECTROPHORESIS, SERUM
A/G Ratio: 0.6 — ABNORMAL LOW (ref 0.7–1.7)
Albumin ELP: 2.4 g/dL — ABNORMAL LOW (ref 2.9–4.4)
Alpha-1-Globulin: 0.4 g/dL (ref 0.0–0.4)
Alpha-2-Globulin: 1.3 g/dL — ABNORMAL HIGH (ref 0.4–1.0)
Beta Globulin: 0.8 g/dL (ref 0.7–1.3)
Gamma Globulin: 1.3 g/dL (ref 0.4–1.8)
Globulin, Total: 3.8 g/dL (ref 2.2–3.9)
Total Protein ELP: 6.2 g/dL (ref 6.0–8.5)

## 2019-05-16 LAB — CK: Total CK: 551 U/L — ABNORMAL HIGH (ref 49–397)

## 2019-05-16 LAB — HEPARIN LEVEL (UNFRACTIONATED)
Heparin Unfractionated: 0.78 IU/mL — ABNORMAL HIGH (ref 0.30–0.70)
Heparin Unfractionated: 0.96 IU/mL — ABNORMAL HIGH (ref 0.30–0.70)

## 2019-05-16 LAB — D-DIMER, QUANTITATIVE: D-Dimer, Quant: 11.23 ug/mL-FEU — ABNORMAL HIGH (ref 0.00–0.50)

## 2019-05-16 LAB — C-REACTIVE PROTEIN: CRP: 9.2 mg/dL — ABNORMAL HIGH (ref <1.0)

## 2019-05-16 MED ORDER — INSULIN ASPART 100 UNIT/ML ~~LOC~~ SOLN
25.0000 [IU] | Freq: Once | SUBCUTANEOUS | Status: AC
  Administered 2019-05-16: 25 [IU] via SUBCUTANEOUS

## 2019-05-16 MED ORDER — INSULIN DETEMIR 100 UNIT/ML ~~LOC~~ SOLN
35.0000 [IU] | Freq: Two times a day (BID) | SUBCUTANEOUS | Status: DC
  Administered 2019-05-16: 35 [IU] via SUBCUTANEOUS
  Filled 2019-05-16 (×2): qty 0.35

## 2019-05-16 MED ORDER — INSULIN DETEMIR 100 UNIT/ML ~~LOC~~ SOLN
45.0000 [IU] | Freq: Two times a day (BID) | SUBCUTANEOUS | Status: DC
  Administered 2019-05-16 – 2019-05-18 (×4): 45 [IU] via SUBCUTANEOUS
  Filled 2019-05-16 (×4): qty 0.45

## 2019-05-16 MED ORDER — POTASSIUM CHLORIDE CRYS ER 20 MEQ PO TBCR
20.0000 meq | EXTENDED_RELEASE_TABLET | Freq: Once | ORAL | Status: AC
  Administered 2019-05-16: 20 meq via ORAL
  Filled 2019-05-16: qty 1

## 2019-05-16 MED ORDER — INSULIN ASPART 100 UNIT/ML ~~LOC~~ SOLN
6.0000 [IU] | Freq: Three times a day (TID) | SUBCUTANEOUS | Status: DC
  Administered 2019-05-16 – 2019-05-19 (×9): 6 [IU] via SUBCUTANEOUS

## 2019-05-16 NOTE — Progress Notes (Addendum)
PROGRESS NOTE  Ricky Brennan [id number] DOB: May 26, 1940 DOA: 05/13/2019  PCP: Tanda Rockers, MD  Brief History/Interval Summary: 79y.o. male, hypertension, Dm2, h/o prostate cancer, pulmonary nodules (followed by [facility]), anterior mediastinal mass,   presented with dyspnea.  Chest x-ray showed mildly increased lung markings likely chronic in nature.  However patient was noted to be profoundly hypoxic.  He was hospitalized for further management.    Reason for Visit: Acute respiratory failure with hypoxia.  Pneumonia due to COVID-19.  DKA.  Acute renal failure.  Consultants: None  Procedures: None  Antibiotics: Anti-infectives (From admission, onward)   Start     Dose/Rate Route Frequency Ordered Stop   05/14/19 2200  remdesivir 100 mg in sodium chloride 0.9 % 100 mL IVPB     100 mg 200 mL/hr over 30 Minutes Intravenous Daily 05/13/19 2118 05/18/19 2159   05/14/19 1200  cefTRIAXone (ROCEPHIN) 1 g in sodium chloride 0.9 % 100 mL IVPB     1 g 200 mL/hr over 30 Minutes Intravenous Every 24 hours 05/14/19 1142 05/19/19 1159   05/14/19 1145  azithromycin (ZITHROMAX) tablet 500 mg     500 mg Oral Daily 05/14/19 1142 05/19/19 0959   05/13/19 2200  remdesivir 200 mg in sodium chloride 0.9% 250 mL IVPB     200 mg 580 mL/hr over 30 Minutes Intravenous Once 05/13/19 2118 05/14/19 0021      Subjective/Interval History: Patient states that he is feeling better but still gets very short of breath with minimal exertion.  He has been doing incentive spirometry.  Denies nausea or vomiting.  Has not ambulated yet.     Assessment/Plan:  Acute Hypoxic Resp. Failure/Pneumonia due to COVID-19   Recent Labs  Lab 05/13/19 1843 05/13/19 1955 05/14/19 0329 05/14/19 1830 05/15/19 0245 05/16/19 0425  DDIMER  --  >20.00*  --   --  >20.00* 11.23*  FERRITIN  --  1,188*  --   --   --   --   CRP  --  39.6*  --   --  16.5* 9.2*  ALT 17  --  18 26 28 28   PROCALCITON  --  7.39  --   --   --    --     Objective findings: Fever: Remains afebrile Oxygen requirements: HFNC 6 L/min.  Saturating in the early 90s.  COVID 19 Therapeutics: Antibacterials: Ceftriaxone and azithromycin initiated on 1/23 for a 5-day course Remdesivir: Day 4 Steroids: Dexamethasone 6 mg daily Diuretics: None Actemra: Not given yet Convalescent Plasma: Transfused 1 unit on 1/23 PUD Prophylaxis: Pepcid DVT Prophylaxis: On IV heparin empirically  Patient's respiratory status seems to be slowly improving.  He still requiring high flow nasal cannula.  Oxygen requirements have improved.  Remains on remdesivir and steroids.  Was given convalescent plasma.  CRP down to 9.2.  Procalcitonin was elevated at 7.39.  Patient started on antibacterials.  Repeat procalcitonin tomorrow. D-dimer was greater than 20 and he is empirically on IV heparin.  Lower extremity Doppler studies pending.  CT angiogram could not be done due to elevated creatinine.  Could not go for VQ scan initially due to tenuous respiratory status.  Continue incentive spirometry, mobilization and prone positioning as much as possible.  Out of bed to chair.    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  Continue IV heparin for now.  Depending  on renal function will transition to either one of the direct acting agents or warfarin in the next few days.   Diabetic ketoacidosis in the setting of known insulin-dependent diabetes Patient apparently did not take insulin for a few days at home as he was feeling sick.  He presented with DKA.  Placed on insulin infusion.  Patient transition to subcutaneous insulin yesterday.  CBGs poorly controlled.  Dose of insulin increased this morning.  Continue SSI.  HbA1c 9.2.  Add meal coverage as well.  May need to further titrate the dose of Levemir.  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  Creatinine on presentation was 4.08.  Most likely due to hypovolemia in the setting of use of ACE inhibitor and diuretics at home.  Patient was hydrated.  Renal function has improved.  Close to baseline.  Stop IV fluids.  Encourage oral intake.  Renal ultrasound does not show any acute findings.  Left renal cyst noted.  UA shows large hemoglobin with only 0-5 RBCs.  CK level 551.  Outpatient follow-up.  Severe protein calorie malnutrition Continue with nutritional supplements.  Encourage oral intake.  Essential hypertension/LVH Blood pressure is reasonably well controlled.  Continue to monitor.  Holding enalapril and hydrochlorothiazide.  Echocardiogram shows normal systolic function.  Grade 1 diastolic dysfunction.  LVH is noted.  History of pulmonary nodules and anterior mediastinal mass Continue with outpatient follow-up for same.  Apparently followed at [facility].  He underwent imaging studies back in August which raised concern for a neoplastic process in the mediastinum.  Possibly thymic origin.  Lymphoma was also thought to be a possibility.   DVT Prophylaxis: On IV heparin Code Status: Full code Family Communication: Patient's son being updated daily. Disposition Plan: Start mobilizing.  Respiratory status is improving.  Hopefully home when improved.     Medications:  Scheduled: . azithromycin  500 mg Oral Daily  . dexamethasone (DECADRON) injection  6 mg Intravenous Q24H  . docusate sodium  100 mg Oral BID  . famotidine  20 mg Oral Daily  . feeding supplement (PRO-STAT SUGAR FREE 64)  30 mL Oral BID  . insulin aspart  0-15 Units Subcutaneous TID WC  . insulin aspart  0-5 Units Subcutaneous QHS  . insulin detemir  35 Units Subcutaneous BID   Continuous: . sodium chloride 50 mL/hr at 05/16/19 0900  . cefTRIAXone (ROCEPHIN)  IV Stopped (05/15/19 1212)  . heparin 1,700 Units/hr (05/16/19  0900)  . remdesivir 100 mg in NS 100 mL Stopped (05/15/19 2146)   [id number], dextrose, polyethylene glycol   Objective:  Vital Signs  Vitals:   05/15/19 2345 05/16/19 0335 05/16/19 0705 05/16/19 0755  BP: 131/69 132/77  (!) 142/79  Pulse: 80 87 96 83  Resp: (!) 25 (!) 21  (!) 25  Temp: 97.6 F (36.4 C) 97.6 F (36.4 C)  97.6 F (36.4 C)  TempSrc: Axillary Axillary  Oral  SpO2: 90% 91%  95%  Weight:      Height:        Intake/Output Summary (Last 24 hours) at 05/16/2019 1125 Last data filed at 05/16/2019 0900 Gross per 24 hour  Intake 1118.97 ml  Output 1425 ml  Net -306.03 ml   Filed Weights   05/14/19 0000 05/14/19 0120  Weight: 99.1 kg 89.5 kg    General appearance: Awake alert.  In no distress Resp: Crackles bilateral bases.  Tachypneic at  rest.  No use of accessory muscles.  No wheezing or rhonchi.   Cardio: S1-S2 is normal regular.  No S3-S4.  No rubs murmurs or bruit GI: Abdomen is soft.  Nontender nondistended.  Bowel sounds are present normal.  No masses organomegaly Extremities: No edema.  Full range of motion of lower extremities. Neurologic: Alert and oriented x3.  No focal neurological deficits.    Lab Results:  Data Reviewed: I have personally reviewed following labs and imaging studies  CBC: Recent Labs  Lab 05/13/19 1843 05/13/19 2020 05/14/19 0329 05/15/19 0245 05/16/19 0425  WBC 13.6*  --  12.4* 13.0* 13.8*  NEUTROABS 12.5*  --  11.4* 12.0* 12.3*  HGB 12.6* 14.3 12.3* 11.9* 12.8*  HCT 38.5* 42.0 36.4* 35.1* 38.5*  MCV 86.7  --  83.7 82.6 83.5  PLT 379  --  349 378 437*    Basic Metabolic Panel: Recent Labs  Lab 05/14/19 0915 05/14/19 1150 05/14/19 1830 05/15/19 0245 05/16/19 0425  NA 139 140 140 142 143  K 4.3 4.0 4.0 3.5 3.5  CL 105 104 107 108 108  CO2 21* 20* 20* 21* 21*  GLUCOSE 213* 210* 218* 247* 237*  BUN 95* 98* 89* 93* 91*  CREATININE 3.36* 3.41* 3.00* 2.69* 2.12*  CALCIUM 8.6* 8.7* 8.9 8.7* 8.6*     GFR: Estimated Creatinine Clearance: 31.1 mL/min (A) (by C-G formula based on SCr of 2.12 mg/dL (H)).  Liver Function Tests: Recent Labs  Lab 05/13/19 1843 05/14/19 0329 05/14/19 1830 05/15/19 0245 05/16/19 0425  AST 18 21 53* 43* 29  ALT 17 18 26 28 28   ALKPHOS 70 64 65 58 72  BILITOT 1.2 0.7 0.8 0.4 0.6  PROT 7.3 7.4 7.5 6.9 7.2  ALBUMIN 2.6* 2.9* 2.8* 2.5* 2.7*    CBG: Recent Labs  Lab 05/15/19 1126 05/15/19 1753 05/15/19 1918 05/15/19 2111 05/16/19 0735  GLUCAP 238* 466* 420* 388* 247*    Lipid Profile: Recent Labs    05/13/19 1945  TRIG 135     Anemia Panel: Recent Labs    05/13/19 1955  FERRITIN 1,188*    Recent Results (from the past 240 hour(s))  Blood Culture (routine x 2)     Status: None (Preliminary result)   Collection Time: 05/13/19  8:03 PM   Specimen: BLOOD LEFT ARM  Result Value Ref Range Status   Specimen Description BLOOD LEFT ARM  Final   Special Requests   Final    BOTTLES DRAWN AEROBIC AND ANAEROBIC Blood Culture adequate volume   Culture   Final    NO GROWTH 3 DAYS Performed at [facility], [place] [address], [place], [place] [address]    Report Status PENDING  Incomplete  Blood Culture (routine x 2)     Status: None (Preliminary result)   Collection Time: 05/13/19  8:06 PM   Specimen: BLOOD RIGHT ARM  Result Value Ref Range Status   Specimen Description BLOOD RIGHT ARM  Final   Special Requests   Final    BOTTLES DRAWN AEROBIC AND ANAEROBIC Blood Culture results may not be optimal due to an excessive volume of blood received in culture bottles   Culture   Final    NO GROWTH 3 DAYS Performed at [facility], [place] [address] [place], [place] [address]    Report Status PENDING  Incomplete  MRSA PCR Screening     Status: None   Collection Time: 05/15/19  8:04 AM   Specimen: Nasopharyngeal  Result Value Ref Range Status   MRSA by PCR NEGATIVE NEGATIVE Final    Comment:        The GeneXpert MRSA Assay  (FDA approved for NASAL specimens only), is one component of a comprehensive MRSA colonization surveillance program. It is not intended to diagnose MRSA infection nor to guide or monitor treatment for MRSA infections. Performed at [facility], [place] [address], [place], [place] [address]       Radiology Studies: [facility] Chest Port 1 View  Result Date: 05/15/2019 CLINICAL DATA:  Pneumonia due to COVID-19 virus. EXAM: PORTABLE CHEST 1 VIEW COMPARISON:  05/13/2019 and 04/26/2016 FINDINGS: Slightly increased interstitial densities in both lungs. Heart and mediastinum are within normal limits and stable. Trachea is midline. Negative for a pneumothorax. Bone structures are unremarkable. Densities and/or vascular crowding at the medial right lung base. IMPRESSION: Slightly increased interstitial lung densities bilaterally. Findings are compatible with pneumonia due to COVID-19. Electronically Signed   By: Markus Daft M.D.   On: 05/15/2019 08:22   ECHOCARDIOGRAM COMPLETE  Result Date: 05/14/2019   ECHOCARDIOGRAM REPORT   Patient Name:   Ricky Brennan Date of Exam: 05/14/2019 Medical Rec #:  [id number]        Height:       71.0 in Accession #:    [id number]       Weight:       197.3 lb Date of Birth:  1941-04-07        BSA:          2.10 m Patient Age:    78 years         BP:           121/77 mmHg Patient Gender: M                HR:           89 bpm. Exam Location:  Inpatient Procedure: 2D Echo Indications:    Dyspnea 786.09 / R06.00  History:        Patient has no prior history of Echocardiogram examinations.                 Risk Factors:Former Smoker, Hypertension and Diabetes. COVID-19.  Sonographer:    Clayton Lefort RDCS (AE) Referring Phys: [id number]  Sonographer Comments: Suboptimal subcostal window. Bright ambient room light. Ecocardiogram completed at [place]. COVID-19. IMPRESSIONS  1. Left ventricular ejection fraction, by visual estimation, is 60 to 65%.  The left ventricle has normal function. There is severely increased left ventricular hypertrophy.  2. Elevated left atrial pressure.  3. Left ventricular diastolic parameters are consistent with Grade I diastolic dysfunction (impaired relaxation).  4. The left ventricle has no regional wall motion abnormalities.  5. Global right ventricle has normal systolic function.The right ventricular size is normal.  6. Left atrial size was normal.  7. Right atrial size was normal.  8. The mitral valve is normal in structure. Trivial mitral valve regurgitation. No evidence of mitral stenosis.  9. The tricuspid valve is normal in structure. Tricuspid valve regurgitation is mild. 10. The aortic valve is tricuspid. Aortic valve regurgitation is mild. Mild aortic valve sclerosis without stenosis. 11. The pulmonic valve was normal in structure. Pulmonic valve regurgitation is not visualized. 12. Moderately elevated pulmonary artery systolic pressure. 13. The inferior vena cava is normal in size with greater than 50% respiratory variability, suggesting right atrial pressure of 3 mmHg. 14. Normal LV systolic function; grade  1 diastolic dysfunction; severe LVH; mild AI and TR; moderate pulmonary hypertension. FINDINGS  Left Ventricle: Left ventricular ejection fraction, by visual estimation, is 60 to 65%. The left ventricle has normal function. The left ventricle has no regional wall motion abnormalities. There is severely increased left ventricular hypertrophy. Left ventricular diastolic parameters are consistent with Grade I diastolic dysfunction (impaired relaxation). Elevated left atrial pressure. Right Ventricle: The right ventricular size is normal.Global RV systolic function is has normal systolic function. The tricuspid regurgitant velocity is 3.23 m/s, and with an assumed right atrial pressure of 3 mmHg, the estimated right ventricular systolic pressure is moderately elevated at 44.7 mmHg. Left Atrium: Left atrial size was  normal in size. Right Atrium: Right atrial size was normal in size Pericardium: There is no evidence of pericardial effusion. Mitral Valve: The mitral valve is normal in structure. Trivial mitral valve regurgitation. No evidence of mitral valve stenosis by observation. Tricuspid Valve: The tricuspid valve is normal in structure. Tricuspid valve regurgitation is mild. Aortic Valve: The aortic valve is tricuspid. Aortic valve regurgitation is mild. Aortic regurgitation PHT measures 590 msec. Mild aortic valve sclerosis is present, with no evidence of aortic valve stenosis. Aortic valve mean gradient measures 3.0 mmHg. Aortic valve peak gradient measures 4.4 mmHg. Aortic valve area, by VTI measures 2.56 cm. Pulmonic Valve: The pulmonic valve was normal in structure. Pulmonic valve regurgitation is not visualized. Pulmonic regurgitation is not visualized. Aorta: The aortic root is normal in size and structure. Venous: The inferior vena cava is normal in size with greater than 50% respiratory variability, suggesting right atrial pressure of 3 mmHg. IAS/Shunts: No atrial level shunt detected by color flow Doppler. Additional Comments: Normal LV systolic function; grade 1 diastolic dysfunction; severe LVH; mild AI and TR; moderate pulmonary hypertension.  LEFT VENTRICLE PLAX 2D LVIDd:         3.98 cm  Diastology LVIDs:         2.86 cm  LV e' lateral:   5.77 cm/s LV PW:         1.58 cm  LV E/e' lateral: 10.5 LV IVS:        1.73 cm  LV e' medial:    3.92 cm/s LVOT diam:     2.00 cm  LV E/e' medial:  15.5 LV SV:         38 ml LV SV Index:   17.84 LVOT Area:     3.14 cm  RIGHT VENTRICLE             IVC RV Basal diam:  2.76 cm     IVC diam: 1.01 cm RV S prime:     11.50 cm/s TAPSE (M-mode): 1.9 cm LEFT ATRIUM             Index       RIGHT ATRIUM           Index LA diam:        2.70 cm 1.29 cm/m  RA Area:     12.30 cm LA Vol (A2C):   40.2 ml 19.18 ml/m RA Volume:   25.70 ml  12.26 ml/m LA Vol (A4C):   38.0 ml 18.13 ml/m  LA Biplane Vol: 41.4 ml 19.75 ml/m  AORTIC VALVE AV Area (Vmax):    2.24 cm AV Area (Vmean):   2.09 cm AV Area (VTI):     2.56 cm AV Vmax:           105.00 cm/s AV Vmean:  80.200 cm/s AV VTI:            0.200 m AV Peak Grad:      4.4 mmHg AV Mean Grad:      3.0 mmHg LVOT Vmax:         74.80 cm/s LVOT Vmean:        53.400 cm/s LVOT VTI:          0.163 m LVOT/AV VTI ratio: 0.82 AI PHT:            590 msec  AORTA Ao Root diam: 3.40 cm MITRAL VALVE                        TRICUSPID VALVE MV Area (PHT): 3.77 cm             TR Peak grad:   41.7 mmHg MV PHT:        58.29 msec           TR Vmax:        323.00 cm/s MV Decel Time: 201 msec MV E velocity: 60.70 cm/s 103 cm/s  SHUNTS MV A velocity: 63.10 cm/s 70.3 cm/s Systemic VTI:  0.16 m MV E/A ratio:  0.96       1.5       Systemic Diam: 2.00 cm  Kirk Ruths MD Electronically signed by Kirk Ruths MD Signature Date/Time: 05/14/2019/3:54:04 PM    Final        LOS: 3 days   [facility] Pager on [email or web address]  05/16/2019, 11:25 AM

## 2019-05-16 NOTE — Progress Notes (Signed)
Inpatient Diabetes Program Recommendations  AACE/ADA: New Consensus Statement on Inpatient Glycemic Control  Target Ranges:  Prepandial:   less than 140 mg/dL      Peak postprandial:   less than 180 mg/dL (1-2 hours)      Critically ill patients:  140 - 180 mg/dL   Results for Ricky Brennan, Ricky Brennan (MRN [id number]) as of 05/16/2019 10:28  Ref. Range 05/15/2019 06:29 05/15/2019 07:52 05/15/2019 08:59 05/15/2019 10:06 05/15/2019 11:26 05/15/2019 17:53 05/15/2019 19:18 05/15/2019 21:11 05/16/2019 07:35  Glucose-Capillary Latest Ref Range: 70 - 99 mg/dL 189 (H) 178 (H) 172 (H) 194 (H) 238 (H) 466 (H) 420 (H) 388 (H) 247 (H)   Review of Glycemic Control  Diabetes history: DM2 Outpatient Diabetes medications: NPH 40 units QAM if CBG >200 mg/dl, NPH 30 units QPM if CBG >200 mg/dl Current orders for Inpatient glycemic control: Levemir 35 units BID, Novolog 0-15 units TID with meals, Novolog 0-5 units QHS; Decadron 6 mg Q24H  Inpatient Diabetes Program Recommendations:    Insulin-If steroids are continued, please consider increasing Levemir to 45 unit BID and add Novolog 6 units TID with meals for meal coverage if patient eats at least 50% of meals.  Thanks, Barnie Alderman, RN, MSN, CDE Diabetes Coordinator Inpatient Diabetes Program [phone] (Team Pager from 8am to 5pm)

## 2019-05-16 NOTE — Progress Notes (Signed)
VASCULAR LAB PRELIMINARY  PRELIMINARY  PRELIMINARY  PRELIMINARY  Bilateral lower extremity venous duplex completed.    Preliminary report:  See CV proc for preliminary results.  Messaged Dr. [place] Pink with results.  Abdelaziz Westenberger, RVT 05/16/2019, 12:00 PM

## 2019-05-16 NOTE — Progress Notes (Signed)
[place] for heparin Indication: r/o VTE in setting of Covid infection  Assessment: 79yo male c/o SOB and anorexia, O2 76% on RA on presentation > inc to 91% on 6L, SARS-CoV-2 screen positive, D-dimer found to be >20 >> to start heparin for concern for VTE while awaiting VQ scan.  Heparin level slightly above goal. No bleeding noted.  Goal of Therapy:  Heparin level 0.3-0.7 units/ml Monitor platelets by anticoagulation protocol: Yes   Plan:  Decrease heparin drip to 1700 units/hr  Check heparin level in ~ 6 hours Daily HL, CBC Monitor for s/sx of bleeding  Thank you for involving pharmacy in this patient's care. Excell Seltzer, PharmD 05/16/2019 6:07 AM

## 2019-05-16 NOTE — Progress Notes (Signed)
ANTICOAGULATION CONSULT NOTE  Pharmacy Consult:  Heparin Indication: r/o VTE in setting of Covid infection  No Known Allergies  Patient Measurements: Height: 5\' 11"  (180.3 cm) Weight: 197 lb 5 oz (89.5 kg) IBW/kg (Calculated) : 75.3 Heparin Dosing Weight: 95kg  Vital Signs: Temp: 97.5 F (36.4 C) (01/25 1200) Temp Source: Axillary (01/25 1200) BP: 137/71 (01/25 1200) Pulse Rate: 86 (01/25 1200)  Labs: Recent Labs    05/13/19 1955 05/13/19 2020 05/14/19 0329 05/14/19 0915 05/14/19 1830 05/14/19 2053 05/15/19 0245 05/16/19 0425 05/16/19 1250  HGB  --    < > 12.3*  --   --   --  11.9* 12.8*  --   HCT  --    < > 36.4*  --   --   --  35.1* 38.5*  --   PLT  --    < > 349  --   --   --  378 437*  --   HEPARINUNFRC  --   --   --    < >  --    < > 0.50 0.78* 0.96*  CREATININE  --    < > 3.65*   < > 3.00*  --  2.69* 2.12*  --   CKTOTAL  --   --   --   --   --   --   --  551*  --   TROPONINIHS 31*  --   --   --   --   --   --   --   --    < > = values in this interval not displayed.    Estimated Creatinine Clearance: 31.1 mL/min (A) (by C-G formula based on SCr of 2.12 mg/dL (H)).  Assessment: 61 YOM c/o SOB and anorexia from Covid-19.  Heparin started for increased oxygen needs and elevated d-dimer.  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  VQ scan pending completion.  Heparin level therapeutic is supra-therapeutic and trending up.  No bleeding reported.  Goal of Therapy:  Heparin level 0.3-0.7 units/ml Monitor platelets by anticoagulation protocol: Yes   Plan:  Reduce heparin gtt to 1500 units/hr Check 8 hr heparin level Daily heparin level and CBC F/U VQ scan  Zylan Almquist D. Mina Marble, PharmD, BCPS, [place] 05/16/2019, 2:23 PM

## 2019-05-16 NOTE — Progress Notes (Addendum)
Pt son updated and all questions answered.

## 2019-05-17 DIAGNOSIS — E1165 Type 2 diabetes mellitus with hyperglycemia: Secondary | ICD-10-CM

## 2019-05-17 LAB — CBC WITH DIFFERENTIAL/PLATELET
Abs Immature Granulocytes: 1.14 10*3/uL — ABNORMAL HIGH (ref 0.00–0.07)
Basophils Absolute: 0.2 10*3/uL — ABNORMAL HIGH (ref 0.0–0.1)
Basophils Relative: 1 %
Eosinophils Absolute: 0 10*3/uL (ref 0.0–0.5)
Eosinophils Relative: 0 %
HCT: 44.2 % (ref 39.0–52.0)
Hemoglobin: 14.6 g/dL (ref 13.0–17.0)
Immature Granulocytes: 7 %
Lymphocytes Relative: 3 %
Lymphs Abs: 0.5 10*3/uL — ABNORMAL LOW (ref 0.7–4.0)
MCH: 27.9 pg (ref 26.0–34.0)
MCHC: 33 g/dL (ref 30.0–36.0)
MCV: 84.4 fL (ref 80.0–100.0)
Monocytes Absolute: 0.8 10*3/uL (ref 0.1–1.0)
Monocytes Relative: 5 %
Neutro Abs: 14.3 10*3/uL — ABNORMAL HIGH (ref 1.7–7.7)
Neutrophils Relative %: 84 %
Platelets: 474 10*3/uL — ABNORMAL HIGH (ref 150–400)
RBC: 5.24 MIL/uL (ref 4.22–5.81)
RDW: 14 % (ref 11.5–15.5)
WBC: 16.8 10*3/uL — ABNORMAL HIGH (ref 4.0–10.5)
nRBC: 0.2 % (ref 0.0–0.2)

## 2019-05-17 LAB — COMPREHENSIVE METABOLIC PANEL
ALT: 32 U/L (ref 0–44)
AST: 32 U/L (ref 15–41)
Albumin: 3.1 g/dL — ABNORMAL LOW (ref 3.5–5.0)
Alkaline Phosphatase: 94 U/L (ref 38–126)
Anion gap: 14 (ref 5–15)
BUN: 77 mg/dL — ABNORMAL HIGH (ref 8–23)
CO2: 19 mmol/L — ABNORMAL LOW (ref 22–32)
Calcium: 8.6 mg/dL — ABNORMAL LOW (ref 8.9–10.3)
Chloride: 110 mmol/L (ref 98–111)
Creatinine, Ser: 1.92 mg/dL — ABNORMAL HIGH (ref 0.61–1.24)
GFR calc Af Amer: 38 mL/min — ABNORMAL LOW (ref >60)
GFR calc non Af Amer: 33 mL/min — ABNORMAL LOW (ref >60)
Glucose, Bld: 221 mg/dL — ABNORMAL HIGH (ref 70–99)
Potassium: 4.2 mmol/L (ref 3.5–5.1)
Sodium: 143 mmol/L (ref 135–145)
Total Bilirubin: 1 mg/dL (ref 0.3–1.2)
Total Protein: 7.5 g/dL (ref 6.5–8.1)

## 2019-05-17 LAB — GLUCOSE, CAPILLARY
Glucose-Capillary: 457 mg/dL — ABNORMAL HIGH (ref 70–99)
Glucose-Capillary: 205 mg/dL — ABNORMAL HIGH (ref 70–99)
Glucose-Capillary: 303 mg/dL — ABNORMAL HIGH (ref 70–99)
Glucose-Capillary: 331 mg/dL — ABNORMAL HIGH (ref 70–99)
Glucose-Capillary: 319 mg/dL — ABNORMAL HIGH (ref 70–99)

## 2019-05-17 LAB — PROCALCITONIN: Procalcitonin: 0.92 ng/mL

## 2019-05-17 LAB — D-DIMER, QUANTITATIVE: D-Dimer, Quant: 7.25 ug/mL-FEU — ABNORMAL HIGH (ref 0.00–0.50)

## 2019-05-17 LAB — HEPARIN LEVEL (UNFRACTIONATED)
Heparin Unfractionated: 0.12 IU/mL — ABNORMAL LOW (ref 0.30–0.70)
Heparin Unfractionated: 0.36 IU/mL (ref 0.30–0.70)
Heparin Unfractionated: 1.05 IU/mL — ABNORMAL HIGH (ref 0.30–0.70)

## 2019-05-17 LAB — C-REACTIVE PROTEIN: CRP: 7.4 mg/dL — ABNORMAL HIGH (ref <1.0)

## 2019-05-17 MED ORDER — DEXAMETHASONE SODIUM PHOSPHATE 4 MG/ML IJ SOLN
4.0000 mg | INTRAMUSCULAR | Status: DC
  Administered 2019-05-18 – 2019-05-20 (×3): 4 mg via INTRAVENOUS
  Filled 2019-05-17 (×3): qty 1

## 2019-05-17 NOTE — Progress Notes (Signed)
ANTICOAGULATION CONSULT NOTE  Pharmacy Consult:  Heparin Indication:  Acute DVT  No Known Allergies  Patient Measurements: Height: 5\' 11"  (180.3 cm) Weight: 197 lb 5 oz (89.5 kg) IBW/kg (Calculated) : 75.3 Heparin Dosing Weight: 95kg  Vital Signs: Temp: 97.9 F (36.6 C) (01/26 1200) Temp Source: Oral (01/26 1200) BP: 132/69 (01/26 1200) Pulse Rate: 94 (01/26 1200)  Labs: Recent Labs    05/15/19 0245 05/15/19 0245 05/16/19 0425 05/16/19 0425 05/16/19 1250 05/16/19 2335 05/17/19 0903  HGB 11.9*   < > 12.8*  --   --   --  14.6  HCT 35.1*  --  38.5*  --   --   --  44.2  PLT 378  --  437*  --   --   --  474*  HEPARINUNFRC 0.50   < > 0.78*   < > 0.96* 1.05* 0.36  CREATININE 2.69*  --  2.12*  --   --   --  1.92*  CKTOTAL  --   --  551*  --   --   --   --    < > = values in this interval not displayed.    Estimated Creatinine Clearance: 34.3 mL/min (A) (by C-G formula based on SCr of 1.92 mg/dL (H)).  Assessment: 43 YOM c/o SOB and anorexia from Covid-19.  Heparin started for increased oxygen needs and elevated d-dimer.  Doppler reveals acute left  DVT and an age indeterminate DVT in the popliteal/tibial veins.    Heparin level therapeutic is therapeutic.  No bleeding reported.  Goal of Therapy:  Heparin level 0.3-0.7 units/ml Monitor platelets by anticoagulation protocol: Yes   Plan:  Reduce heparin gtt to 1500 units/hr Check confirmatory heparin level Daily heparin level and CBC  [place]dester Nilson D. Mina Marble, PharmD, BCPS, [place] 05/17/2019, 1:30 PM

## 2019-05-17 NOTE — Evaluation (Signed)
Physical Therapy Evaluation Patient Details Name: Ricky Brennan MRN: [id number] DOB: [birth year] Today's Date: 05/17/2019   History of Present Illness  79 y.o. male admitted on 05/13/19 for dyspnea.  Pt dx with acute respiratory failure with hypoxia due to COVID 19 PNA, DKA, acute renal failure.  Pt also found to have L LE DVT and suspicious of PE.  Pt on heparin drip.  Pt with significant PMH of HTN, DM, prostate CA, CKD III, h/o pulmonary nodules and anterior mediastinal mass (followed by [facility]).    Clinical Impression  Pt did not want to get up with me today because he "doesn't feel good", however, I discovered he was saturated in urine and convinced him to get up to the chair for a while to change his bed and his gown.  He did not want to stay up and did not want to walk.  I educated him that even on his bad days he needs to get up and move and walk because it helps his body and his lungs get stronger.  He seemed to think that doing his incentive spirometer in the bed was enough exercise for his lungs.  He will need continued strong encouragement to mobilize more.  OT to attempt again later today.   PT to follow acutely for deficits listed below.      Follow Up Recommendations Home health PT;Supervision - Intermittent    Equipment Recommendations  Rolling walker with 5" wheels    Recommendations for Other Services   NA    Precautions / Restrictions Precautions Precautions: Fall;Other (comment) Precaution Comments: monitor O2      Mobility  Bed Mobility Overal bed mobility: Modified Independent             General bed mobility comments: Pt found to have completely saturated his bed with urine.  Was only agreeable to get up for bed and gown change and then immediately wanted back to bed.   Transfers Overall transfer level: Needs assistance   Transfers: Sit to/from Stand;Stand Pivot Transfers Sit to Stand: Min guard Stand pivot transfers: Min guard       General transfer  comment: Heavy reliance on bed rail and recliner armrests for stability during trasnfer.    Ambulation/Gait             General Gait Details: not agreeable to gait or OOB to chair for any extended period of time.       Balance Overall balance assessment: Needs assistance Sitting-balance support: Feet supported;No upper extremity supported Sitting balance-Leahy Scale: Good     Standing balance support: Single extremity supported Standing balance-Leahy Scale: Poor Standing balance comment: heavily reliant on railings for support in standing.                              Pertinent Vitals/Pain Pain Assessment: Faces Faces Pain Scale: Hurts little more Pain Location: head Pain Descriptors / Indicators: Aching Pain Intervention(s): Limited activity within patient's tolerance;Monitored during session;Premedicated before session;Repositioned    Home Living Family/patient expects to be discharged to:: Private residence Living Arrangements: Alone Available Help at Discharge: Family;Available PRN/intermittently(sister and son) Type of Home: House Home Access: Stairs to enter Entrance Stairs-Rails: Right Entrance Stairs-Number of Steps: 3 Home Layout: One level Home Equipment: Grab bars - toilet;Grab bars - tub/shower Additional Comments: "retirement community, my sister lives upstairs"    Prior Function Level of Independence: Needs assistance   Gait / Transfers Assistance Needed:  Pt does not drive son or sister takes him to appointments.  Not on O2 PTA, independent with no reports of falls in the last 6 mo.  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 Likes to [employer].   ADL's / Homemaking Assistance Needed: reports independence, although sister sometimes cooks for him.         Hand Dominance   Dominant Hand: Right    Extremity/Trunk Assessment   Upper Extremity  Assessment Upper Extremity Assessment: Defer to OT evaluation    Lower Extremity Assessment Lower Extremity Assessment: Generalized weakness    Cervical / Trunk Assessment Cervical / Trunk Assessment: Normal  Communication   Communication: No difficulties  Cognition Arousal/Alertness: Awake/alert Behavior During Therapy: WFL for tasks assessed/performed Overall Cognitive Status: Within Functional Limits for tasks assessed                                 General Comments: not specifically tested, conversation normal, but reluctant to get up as he "doesn't feel good".  Low health IQ (no reasoning with him).       General Comments General comments (skin integrity, edema, etc.): O2 sats dropped as low as mid 70s during mobility OOB to chair, quickly rebounded in <3 mins to 90s.  Pt likes to use IS to help calm his breathing.  He is on 3 L O2 [place] and measured via pedi earlobe.     Exercises Other Exercises Other Exercises: IS x 10 with max inspired volume 600 mL.   Assessment/Plan    PT Assessment Patient needs continued PT services  PT Problem List Decreased strength;Decreased activity tolerance;Decreased balance;Decreased mobility;Decreased cognition;Decreased knowledge of use of DME;Decreased safety awareness;Decreased knowledge of precautions;Cardiopulmonary status limiting activity;Pain       PT Treatment Interventions DME instruction;Gait training;Stair training;Functional mobility training;Therapeutic activities;Therapeutic exercise;Balance training;Neuromuscular re-education;Cognitive remediation;Patient/family education;Modalities    PT Goals (Current goals can be found in the Care Plan section)  Acute Rehab PT Goals Patient Stated Goal: to stay in the bed because he doesn't feel good PT Goal Formulation: With patient Time For Goal Achievement: 05/31/19 Potential to Achieve Goals: Good    Frequency Min 3X/week           AM-PAC PT "6 Clicks" Mobility   Outcome Measure Help needed turning from your back to your side while in a flat bed without using bedrails?: None Help needed moving from lying on your back to sitting on the side of a flat bed without using bedrails?: None Help needed moving to and from a bed to a chair (including a wheelchair)?: A Little Help needed standing up from a chair using your arms (e.g., wheelchair or bedside chair)?: A Little Help needed to walk in hospital room?: A Little Help needed climbing 3-5 steps with a railing? : A Little 6 Click Score: 20    End of Session Equipment Utilized During Treatment: Oxygen Activity Tolerance: Patient limited by fatigue;Patient limited by pain Patient left: in bed;with call bell/phone within reach   PT Visit Diagnosis: Muscle weakness (generalized) (M62.81);Difficulty in walking, not elsewhere classified (R26.2);Pain Pain - Right/Left: (head) Pain - part of body: (headache)    Time: [id number] PT Time Calculation (min) (ACUTE ONLY): 41 min   Charges:       Verdene Lennert, PT, DPT  Acute Rehabilitation [phone]) [phone]  pager #[phone] office  @ [facility]: [phone]   PT Evaluation $PT Eval Moderate Complexity: 1 Mod PT Treatments $Therapeutic Activity: 23-37 mins        05/17/2019, 11:48 AM

## 2019-05-17 NOTE — Progress Notes (Signed)
Occupational Therapy Evaluation Patient Details Name: Ricky Brennan MRN: [id number] DOB: Nov 20, 1940 Today's Date: 05/17/2019    History of Present Illness 79 y.o. male admitted on 05/13/19 for dyspnea.  Pt dx with acute respiratory failure with hypoxia due to COVID 19 PNA, DKA, acute renal failure.  Pt also found to have L LE DVT and suspicious of PE.  Pt on heparin drip.  Pt with significant PMH of HTN, DM, prostate CA, CKD III, h/o pulmonary nodules and anterior mediastinal mass (followed by [facility]).     Clinical Impression   PTA pt lived alone, independent in ADLs and mobility. Pt reports that he is independent with all IADLs except laundry and grocery shopping which his sister and son assist him with. Pt does not ambulate with an assistive device and reports 0 falls in the last 6 months. Pt does not use oxygen at home and is currently on 3L [place]. Pt currently independent to min assist for self-care and functional transfer tasks. Pt required encouragement to engage in out of bed activities this date. Pt tolerated sitting edge of bed 10+ min with supervision. Educated pt on breathing strategies, relaxation techniques, and fall prevention with fair understanding and follow through. Pt transferred to bedside chair with min assist for balance and safety. SpO2 decreased to 84% on 3L [place] with activity. Pt unable to increase oxygen following 4-5 min seated rest break with O2 increased to 4L. Quick return to 94%. Pt reported mod shortness of breath throughout. Pt required increased time to complete all tasks, with pt declining further activity. Pt demonstrates decreased strength, endurance, balance, standing tolerance, and activity tolerance impacting ability to complete self-care and functional transfer tasks. Recommend skilled OT services to address above deficits in order to promote function and prevent further decline. Recommend [facility] OT for continued rehab following hospital discharge.    Follow Up  Recommendations  Home health OT;Supervision/Assistance - 24 hour    Equipment Recommendations  3 in 1 bedside commode(for use in shower)    Recommendations for Other Services       Precautions / Restrictions Precautions Precautions: Fall;Other (comment) Precaution Comments: monitor O2 Restrictions Weight Bearing Restrictions: No      Mobility Bed Mobility Overal bed mobility: Modified Independent             General bed mobility comments: HOB elevated, use of bed rail  Transfers Overall transfer level: Needs assistance Equipment used: 1 person hand held assist Transfers: Sit to/from Stand;Stand Pivot Transfers Sit to Stand: Min assist Stand pivot transfers: Min assist       General transfer comment: Stand pivot to bedside chair. Noted 0 instances of LOB, however pt unsteady on feet.     Balance Overall balance assessment: Needs assistance Sitting-balance support: Feet supported;No upper extremity supported Sitting balance-Leahy Scale: Good     Standing balance support: Single extremity supported Standing balance-Leahy Scale: Poor Standing balance comment: heavily reliant on railings for support in standing.                            ADL either performed or assessed with clinical judgement   ADL Overall ADL's : Needs assistance/impaired Eating/Feeding: Independent;Sitting   Grooming: Set up;Supervision/safety;Sitting   Upper Body Bathing: Set up;Supervision/ safety;Sitting   Lower Body Bathing: Min guard;Minimal assistance;Sit to/from stand   Upper Body Dressing : Set up;Supervision/safety;Sitting   Lower Body Dressing: Supervision/safety;Min guard;Sit to/from stand   Toilet Transfer: Minimal assistance;BSC  Toileting- Clothing Manipulation and Hygiene: Minimal assistance;Sit to/from stand       Functional mobility during ADLs: Minimal assistance General ADL Comments: Pt only agreeable to transferring to bedside chair this date. Hand  held min assist.      Vision Baseline Vision/History: No visual deficits       Perception     Praxis      Pertinent Vitals/Pain Pain Assessment: No/denies pain Faces Pain Scale: Hurts little more Pain Location: head Pain Descriptors / Indicators: Aching Pain Intervention(s): Limited activity within patient's tolerance;Monitored during session;Premedicated before session;Repositioned     Hand Dominance Right   Extremity/Trunk Assessment Upper Extremity Assessment Upper Extremity Assessment: Generalized weakness   Lower Extremity Assessment Lower Extremity Assessment: Defer to PT evaluation   Cervical / Trunk Assessment Cervical / Trunk Assessment: Normal   Communication Communication Communication: No difficulties   Cognition Arousal/Alertness: Lethargic Behavior During Therapy: WFL for tasks assessed/performed Overall Cognitive Status: Within Functional Limits for tasks assessed                                 General Comments: A&O x 4. Pt able to answer questions appropriately. Requires encouragement to participate in therapy.   General Comments  Pt on 3L [place] with SpO2 in low 90s at rest. SpO2 decreased to 84% with mobility. Pt unable to increase following 4-5 seated rest break. O2 increased to 4L [place] noting quick return to 94%. RN updated.     Exercises Exercises: Other exercises Other Exercises Other Exercises: Incentive spirometer x 10. Pulling 525mL.    Shoulder Instructions      Home Living Family/patient expects to be discharged to:: Private residence Living Arrangements: Alone Available Help at Discharge: Family;Available PRN/intermittently(sister and son) Type of Home: Apartment Home Access: Stairs to enter [employer] of Steps: 3 Entrance Stairs-Rails: Right Home Layout: One level     Bathroom Shower/Tub: Teacher, early years/pre: Standard     Home Equipment: Grab bars - toilet;Grab bars - tub/shower    Additional Comments: Sister lives in the same retirement community. Pt states "she lives a block from me."      Prior Functioning/Environment Level of Independence: Needs assistance  Gait / Transfers Assistance Needed: Pt does not drive son or sister takes him to appointments.  Not on O2 PTA, independent with no reports of falls in the last 6 mo.  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 Likes to [employer].  ADL's / Homemaking Assistance Needed: Pt independent in ADLs. Pt reports that sister completes all his laundry. Pt is able to cook, however sister cooks for him on Sundays. Pt does not drive. Sister and son able to take him grocery shopping.            OT Problem List: Decreased strength;Decreased activity tolerance;Impaired balance (sitting and/or standing);Decreased safety awareness;Cardiopulmonary status limiting activity      OT Treatment/Interventions: Self-care/ADL training;Therapeutic exercise;Neuromuscular education;Energy conservation;DME and/or AE instruction;Therapeutic activities;Patient/family education;Balance training    OT Goals(Current goals can be found in the care plan section) Acute Rehab OT Goals Patient Stated Goal: Initially resistant to mobility, but with encouragement agreeable to sitting up in chair. Time For Goal Achievement: 05/31/19 Potential to Achieve Goals: Good ADL Goals Pt Will Perform Grooming: with modified independence;standing Pt Will Perform Lower Body Bathing: with  modified independence;sit to/from stand Pt Will Perform Lower Body Dressing: with modified independence;sit to/from stand Pt Will Transfer to Toilet: with modified independence;ambulating;regular height toilet Pt Will Perform Toileting - Clothing Manipulation and hygiene: with modified independence;sit to/from stand Additional ADL Goal #1: Pt to recall and verbalize 3 relaxation  strategies with 0 verbal cues. Additional ADL Goal #2: Pt to recall and verbalize 3 energy conservation strategies with 0 verbal cues. Additional ADL Goal #3: Pt to tolerate standing up to 10 min with modified independence and SpO2 maintaining above 90%, in preparation for ADLs.  OT Frequency: Min 3X/week   Barriers to D/C:            Co-evaluation              AM-PAC OT "6 Clicks" Daily Activity     Outcome Measure Help from another person eating meals?: None Help from another person taking care of personal grooming?: A Little Help from another person toileting, which includes using toliet, bedpan, or urinal?: A Little Help from another person bathing (including washing, rinsing, drying)?: A Little Help from another person to put on and taking off regular upper body clothing?: A Little Help from another person to put on and taking off regular lower body clothing?: A Little 6 Click Score: 19   End of Session Equipment Utilized During Treatment: Oxygen Nurse Communication: Mobility status  Activity Tolerance: Patient limited by fatigue(Limited by SOB and motivation level.) Patient left: in chair;with call bell/phone within reach;with chair alarm set  OT Visit Diagnosis: Unsteadiness on feet (R26.81);Muscle weakness (generalized) (M62.81)                Time: [id number] OT Time Calculation (min): 39 min Charges:  OT General Charges $OT Visit: 1 Visit OT Evaluation $OT Eval Moderate Complexity: 1 Mod OT Treatments $Therapeutic Activity: 23-37 mins  Mauri Brooklyn OTR/L [phone]   Mauri Brooklyn 05/17/2019, 12:51 PM

## 2019-05-17 NOTE — Progress Notes (Signed)
PROGRESS NOTE  Ricky Brennan [id number] DOB: 07/25/1940 DOA: 05/13/2019  PCP: Tanda Rockers, MD  Brief History/Interval Summary: 79y.o. male, hypertension, Dm2, h/o prostate cancer, pulmonary nodules (followed by [facility]), anterior mediastinal mass,   presented with dyspnea.  Chest x-ray showed mildly increased lung markings likely chronic in nature.  However patient was noted to be profoundly hypoxic.  He was hospitalized for further management.    Reason for Visit: Acute respiratory failure with hypoxia.  Pneumonia due to COVID-19.  DKA.  Acute renal failure.  Consultants: None  Procedures: None  Antibiotics: Anti-infectives (From admission, onward)   Start     Dose/Rate Route Frequency Ordered Stop   05/14/19 2200  remdesivir 100 mg in sodium chloride 0.9 % 100 mL IVPB     100 mg 200 mL/hr over 30 Minutes Intravenous Daily 05/13/19 2118 05/18/19 2159   05/14/19 1200  cefTRIAXone (ROCEPHIN) 1 g in sodium chloride 0.9 % 100 mL IVPB     1 g 200 mL/hr over 30 Minutes Intravenous Every 24 hours 05/14/19 1142 05/19/19 1159   05/14/19 1145  azithromycin (ZITHROMAX) tablet 500 mg     500 mg Oral Daily 05/14/19 1142 05/19/19 0959   05/13/19 2200  remdesivir 200 mg in sodium chloride 0.9% 250 mL IVPB     200 mg 580 mL/hr over 30 Minutes Intravenous Once 05/13/19 2118 05/14/19 0021      Subjective/Interval History: Patient states that he is feeling better.  Still gets short of breath with exertion.  Denies any chest pain.  No nausea vomiting.   Assessment/Plan:  Acute Hypoxic Resp. Failure/Pneumonia due to COVID-19   Recent Labs  Lab 05/13/19 1843 05/13/19 1955 05/14/19 0329 05/14/19 1830 05/15/19 0245 05/16/19 0425 05/17/19 0903  DDIMER  --  >20.00*  --   --  >20.00* 11.23* 7.25*  FERRITIN  --  1,188*  --   --   --   --   --   CRP  --  39.6*  --   --  16.5* 9.2* 7.4*  ALT   < >  --  18 26 28 28  32  PROCALCITON  --  7.39  --   --   --   --   --    < > = values in  this interval not displayed.    Objective findings: Fever: Afebrile Oxygen requirements: Nasal cannula 3 to 4 L.  Saturating in the mid 90s.  COVID 19 Therapeutics: Antibacterials: Ceftriaxone and azithromycin initiated on 1/23 for a 5-day course Remdesivir: Day 5 Steroids: Dexamethasone 6 mg daily Diuretics: None Actemra: Not given yet Convalescent Plasma: Transfused 1 unit on 1/23 PUD Prophylaxis: Pepcid DVT Prophylaxis: On IV heparin   Patient seems to be stable from a respiratory standpoint.  His oxygen requirements have been improving.  His inflammatory markers have improved.  He feels better.  Continue to mobilize.  Incentive spirometry.  Out of bed to chair.  He is also on antibacterials for elevated procalcitonin.  Repeat procalcitonin was ordered for this morning but results are still not back.  5-day course of antibacterials.  D-dimer was greater than 20 and he was empirically started on IV heparin.  DVT detected in the lower extremities.  See below.  He will complete course of remdesivir today.  He remains on steroids.  He also received convalescent plasma on 1/23.  Elevated WBC today could be due to steroids.  Continue to monitor.  Acute DVT left lower extremity, femoral vein/age-indeterminate DVT  in left popliteal and posterior tibial Remains on IV heparin.  Continue for now.  Depending on renal function he can be transition to oral anticoagulation in the next 1 to 2 days.  CT angiogram of chest could not be done due to elevated creatinine.  Since VQ scan will not change management at this time we will hold off on it.    Diabetic ketoacidosis in the setting of known insulin-dependent diabetes Patient apparently did not take insulin for a few days at home as he was feeling sick.  He presented with DKA.  Placed on insulin infusion.  Patient was transitioned to subcutaneous insulin.  CBGs still poorly controlled.  Dose of Levemir was increased yesterday.  We will increase the dose  further today.  HbA1c 9.2.  Continue SSI as well.  Acute renal failure on chronic kidney disease stage IIIb/normal anion gap metabolic acidosis Based on care everywhere it appears that his baseline creatinine is around 2.  Creatinine on presentation was 4.08.  Most likely due to hypovolemia in the setting of use of ACE inhibitor and diuretics at home.  Patient was hydrated.  Renal function has improved.  Creatinine is down to 1.92 today.  IV fluids were stopped yesterday.  Renal ultrasound does not show any acute findings.  Left renal cyst noted.  UA shows large hemoglobin with only 0-5 RBCs.  CK level 551.  Outpatient follow-up.  Severe protein calorie malnutrition Continue with nutritional supplements.  Encourage oral intake.  Essential hypertension/LVH Blood pressure is reasonably well controlled.  Continue to monitor.  Holding enalapril and hydrochlorothiazide.  Echocardiogram shows normal systolic function.  Grade 1 diastolic dysfunction.  LVH is noted.  History of pulmonary nodules and anterior mediastinal mass Continue with outpatient follow-up for same.  Apparently followed at [facility].  He underwent imaging studies back in August which raised concern for a neoplastic process in the mediastinum.  Possibly thymic origin.  Lymphoma was also thought to be a possibility.   DVT Prophylaxis: On IV heparin Code Status: Full code Family Communication: Patient's son being updated daily. Disposition Plan: PT and OT to evaluate.  Hopefully will be able to return home when improved.   Medications:  Scheduled: . azithromycin  500 mg Oral Daily  . dexamethasone (DECADRON) injection  6 mg Intravenous Q24H  . docusate sodium  100 mg Oral BID  . famotidine  20 mg Oral Daily  . feeding supplement (PRO-STAT SUGAR FREE 64)  30 mL Oral BID  . insulin aspart  0-15 Units Subcutaneous TID WC  . insulin aspart  0-5 Units Subcutaneous QHS  . insulin aspart  6 Units Subcutaneous TID WC  . insulin detemir  45  Units Subcutaneous BID   Continuous: . cefTRIAXone (ROCEPHIN)  IV Stopped (05/16/19 1240)  . heparin 1,300 Units/hr (05/17/19 0618)  . remdesivir 100 mg in NS 100 mL Stopped (05/16/19 2150)   [id number], dextrose, polyethylene glycol   Objective:  Vital Signs  Vitals:   05/16/19 2155 05/16/19 2359 05/17/19 0314 05/17/19 0800  BP:  (!) 151/83 (!) 158/80 (!) 156/80  Pulse: 85 93 88 85  Resp: 20 (!) 25 (!) 25 (!) 24  Temp: 97.6 F (36.4 C) 97.6 F (36.4 C) 97.8 F (36.6 C) 97.9 F (36.6 C)  TempSrc: Axillary Axillary Axillary Oral  SpO2: (!) 89% 90% 91% 96%  Weight:      Height:        Intake/Output Summary (Last 24 hours) at 05/17/2019 1042 Last data filed at  05/17/2019 0800 Gross per 24 hour  Intake 819.17 ml  Output 1675 ml  Net -855.83 ml   Filed Weights   05/14/19 0000 05/14/19 0120  Weight: 99.1 kg 89.5 kg    General appearance: Awake alert.  In no distress Resp: Crackles bilateral bases.  Mildly tachypneic at rest.  No use of accessory muscles.  No wheezing or rhonchi.   Cardio: S1-S2 is normal regular.  No S3-S4.  No rubs murmurs or bruit GI: Abdomen is soft.  Nontender nondistended.  Bowel sounds are present normal.  No masses organomegaly Extremities: No edema.  Full range of motion of lower extremities. Neurologic: Alert and oriented x3.  No focal neurological deficits.    Lab Results:  Data Reviewed: I have personally reviewed following labs and imaging studies  CBC: Recent Labs  Lab 05/13/19 1843 05/13/19 1843 05/13/19 2020 05/14/19 0329 05/15/19 0245 05/16/19 0425 05/17/19 0903  WBC 13.6*  --   --  12.4* 13.0* 13.8* 16.8*  NEUTROABS 12.5*  --   --  11.4* 12.0* 12.3* 14.3*  HGB 12.6*   < > 14.3 12.3* 11.9* 12.8* 14.6  HCT 38.5*   < > 42.0 36.4* 35.1* 38.5* 44.2  MCV 86.7  --   --  83.7 82.6 83.5 84.4  PLT 379  --   --  349 378 437* 474*   < > = values in this interval not displayed.    Basic Metabolic Panel: Recent Labs  Lab  05/14/19 1150 05/14/19 1830 05/15/19 0245 05/16/19 0425 05/17/19 0903  NA 140 140 142 143 143  K 4.0 4.0 3.5 3.5 4.2  CL 104 107 108 108 110  CO2 20* 20* 21* 21* 19*  GLUCOSE 210* 218* 247* 237* 221*  BUN 98* 89* 93* 91* 77*  CREATININE 3.41* 3.00* 2.69* 2.12* 1.92*  CALCIUM 8.7* 8.9 8.7* 8.6* 8.6*    GFR: Estimated Creatinine Clearance: 34.3 mL/min (A) (by C-G formula based on SCr of 1.92 mg/dL (H)).  Liver Function Tests: Recent Labs  Lab 05/14/19 0329 05/14/19 1830 05/15/19 0245 05/16/19 0425 05/17/19 0903  AST 21 53* 43* 29 32  ALT 18 26 28 28  32  ALKPHOS 64 65 58 72 94  BILITOT 0.7 0.8 0.4 0.6 1.0  PROT 7.4 7.5 6.9 7.2 7.5  ALBUMIN 2.9* 2.8* 2.5* 2.7* 3.1*    CBG: Recent Labs  Lab 05/15/19 2111 05/16/19 0735 05/16/19 1115 05/16/19 2145 05/17/19 0706  GLUCAP 388* 247* 373* 345* 205*     Recent Results (from the past 240 hour(s))  Blood Culture (routine x 2)     Status: None (Preliminary result)   Collection Time: 05/13/19  8:03 PM   Specimen: BLOOD LEFT ARM  Result Value Ref Range Status   Specimen Description BLOOD LEFT ARM  Final   Special Requests   Final    BOTTLES DRAWN AEROBIC AND ANAEROBIC Blood Culture adequate volume   Culture   Final    NO GROWTH 4 DAYS Performed at [facility], [place] [address], [place], [place] [address]    Report Status PENDING  Incomplete  Blood Culture (routine x 2)     Status: None (Preliminary result)   Collection Time: 05/13/19  8:06 PM   Specimen: BLOOD RIGHT ARM  Result Value Ref Range Status   Specimen Description BLOOD RIGHT ARM  Final   Special Requests   Final    BOTTLES DRAWN AEROBIC AND ANAEROBIC Blood Culture results may not be optimal due to an  excessive volume of blood received in culture bottles   Culture   Final    NO GROWTH 4 DAYS Performed at [facility], [place] [address], [place], [place] [address]    Report Status PENDING  Incomplete  MRSA PCR Screening     Status: None    Collection Time: 05/15/19  8:04 AM   Specimen: Nasopharyngeal  Result Value Ref Range Status   MRSA by PCR NEGATIVE NEGATIVE Final    Comment:        The GeneXpert MRSA Assay (FDA approved for NASAL specimens only), is one component of a comprehensive MRSA colonization surveillance program. It is not intended to diagnose MRSA infection nor to guide or monitor treatment for MRSA infections. Performed at [facility], [place] [address], [place],  [address]       Radiology Studies: VAS [place] LOWER EXTREMITY VENOUS (DVT)  Result Date: 05/16/2019  Lower Venous Study Indications: Edema, and Covid-19, elevated D-Dimer.  Comparison Study: Prior study from 12/14/15 is available for comparison. Performing Technologist: Sharion Dove RVS  Examination Guidelines: A complete evaluation includes B-mode imaging, spectral Doppler, color Doppler, and power Doppler as needed of all accessible portions of each vessel. Bilateral testing is considered an integral part of a complete examination. Limited examinations for reoccurring indications may be performed as noted.  +---------+---------------+---------+-----------+----------+--------------+ RIGHT    CompressibilityPhasicitySpontaneityPropertiesThrombus Aging +---------+---------------+---------+-----------+----------+--------------+ CFV      Full           Yes      Yes                                 +---------+---------------+---------+-----------+----------+--------------+ SFJ      Full                                                        +---------+---------------+---------+-----------+----------+--------------+ FV Prox  Full                                                        +---------+---------------+---------+-----------+----------+--------------+ FV Mid   Full                                                        +---------+---------------+---------+-----------+----------+--------------+ FV  DistalFull                                                        +---------+---------------+---------+-----------+----------+--------------+ PFV      Full                                                        +---------+---------------+---------+-----------+----------+--------------+  POP      Full           Yes      Yes                                 +---------+---------------+---------+-----------+----------+--------------+ PTV      Full                                                        +---------+---------------+---------+-----------+----------+--------------+ PERO     Full                                                        +---------+---------------+---------+-----------+----------+--------------+   +---------+---------------+---------+-----------+----------+-----------------+ LEFT     CompressibilityPhasicitySpontaneityPropertiesThrombus Aging    +---------+---------------+---------+-----------+----------+-----------------+ CFV      Full           Yes      Yes                                    +---------+---------------+---------+-----------+----------+-----------------+ SFJ      Full                                                           +---------+---------------+---------+-----------+----------+-----------------+ FV Prox  Full                                                           +---------+---------------+---------+-----------+----------+-----------------+ FV Mid   Partial                                      Chronic           +---------+---------------+---------+-----------+----------+-----------------+ FV DistalPartial                                      Chronic           +---------+---------------+---------+-----------+----------+-----------------+ PFV      Full                                                           +---------+---------------+---------+-----------+----------+-----------------+ POP       None           No       No  Age Indeterminate +---------+---------------+---------+-----------+----------+-----------------+ PTV      None                                         Age Indeterminate +---------+---------------+---------+-----------+----------+-----------------+ PERO     Full                                                           +---------+---------------+---------+-----------+----------+-----------------+     Summary: Right: There is no evidence of deep vein thrombosis in the lower extremity. Left: Findings consistent with acute deep vein thrombosis involving the left femoral vein. Findings consistent with age indeterminate deep vein thrombosis involving the left popliteal vein, and left posterior tibial veins. Patient had acute DVT in the femoral, popliteal, and tibial veins in study done 12/14/15  *See table(s) above for measurements and observations. Electronically signed by Monica Martinez MD on 05/16/2019 at 4:47:34 PM.    Final        LOS: 4 days   [facility] Pager on [email or web address]  05/17/2019, 10:42 AM

## 2019-05-17 NOTE — Progress Notes (Signed)
ANTICOAGULATION CONSULT NOTE  Pharmacy Consult:  Heparin Indication: r/o VTE in setting of Covid infection  Assessment: 28 YOM c/o SOB and anorexia from Covid-19.  Heparin started for increased oxygen needs and elevated d-dimer.  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  VQ scan pending completion.  Heparin level is supra-therapeutic despite rate decrease.  No bleeding noted reported.  Goal of Therapy:  Heparin level 0.3-0.7 units/ml Monitor platelets by anticoagulation protocol: Yes   Plan:  Reduce heparin gtt to 1300 units/hr Check 8 hr heparin level Daily heparin level and CBC F/U VQ scan  Thanks for allowing pharmacy to be a part of this patient's care.  Excell Seltzer, PharmD Clinical Pharmacist 05/17/2019, 12:31 AM

## 2019-05-17 NOTE — Progress Notes (Signed)
ANTICOAGULATION CONSULT NOTE  Pharmacy Consult:  Heparin Indication:  Acute DVT  No Known Allergies  Patient Measurements: Height: 5\' 11"  (180.3 cm) Weight: 197 lb 5 oz (89.5 kg) IBW/kg (Calculated) : 75.3 Heparin Dosing Weight: 89.5kg  Vital Signs: Temp: 97.6 F (36.4 C) (01/26 1600) Temp Source: Oral (01/26 1600) BP: 140/78 (01/26 1600) Pulse Rate: 88 (01/26 1600)  Labs: Recent Labs    05/15/19 0245 05/15/19 0245 05/16/19 0425 05/16/19 1250 05/16/19 2335 05/17/19 0903 05/17/19 1545  HGB 11.9*   < > 12.8*  --   --  14.6  --   HCT 35.1*  --  38.5*  --   --  44.2  --   PLT 378  --  437*  --   --  474*  --   HEPARINUNFRC 0.50   < > 0.78*   < > 1.05* 0.36 0.12*  CREATININE 2.69*  --  2.12*  --   --  1.92*  --   CKTOTAL  --   --  551*  --   --   --   --    < > = values in this interval not displayed.    Estimated Creatinine Clearance: 34.3 mL/min (A) (by C-G formula based on SCr of 1.92 mg/dL (H)).  Assessment: 44 YOM c/o SOB and anorexia from Covid-19.  Heparin started for increased oxygen needs and elevated d-dimer.  Doppler revealed acute left  DVT and an age indeterminate DVT in the popliteal/tibial veins.    Heparin level has dropped to 0.12, now subtherapeutic on heparin rate 1300 units/hr.  This morning HL was therapeutic on same raate. No issues/interruptions with infusion and no bleeding noted per RN.   Goal of Therapy:  Heparin level 0.3-0.7 units/ml Monitor platelets by anticoagulation protocol: Yes   Plan:   Increase heparin gtt to 1450 units/hr Check 6 hr heparin level ~ MN tonight Daily heparin level and CBC  Thank you for allowing pharmacy to be part of this patients care team.  Nicole Cella, RPh Clinical Pharmacist 05/17/2019, 5:18 PM

## 2019-05-18 DIAGNOSIS — N179 Acute kidney failure, unspecified: Secondary | ICD-10-CM

## 2019-05-18 DIAGNOSIS — I82409 Acute embolism and thrombosis of unspecified deep veins of unspecified lower extremity: Secondary | ICD-10-CM

## 2019-05-18 DIAGNOSIS — J1282 Pneumonia due to coronavirus disease 2019: Secondary | ICD-10-CM

## 2019-05-18 DIAGNOSIS — N1832 Chronic kidney disease, stage 3b: Secondary | ICD-10-CM

## 2019-05-18 DIAGNOSIS — E1121 Type 2 diabetes mellitus with diabetic nephropathy: Secondary | ICD-10-CM

## 2019-05-18 DIAGNOSIS — I10 Essential (primary) hypertension: Secondary | ICD-10-CM

## 2019-05-18 DIAGNOSIS — E46 Unspecified protein-calorie malnutrition: Secondary | ICD-10-CM

## 2019-05-18 DIAGNOSIS — I82412 Acute embolism and thrombosis of left femoral vein: Secondary | ICD-10-CM

## 2019-05-18 DIAGNOSIS — U071 COVID-19: Principal | ICD-10-CM

## 2019-05-18 DIAGNOSIS — E1122 Type 2 diabetes mellitus with diabetic chronic kidney disease: Secondary | ICD-10-CM

## 2019-05-18 DIAGNOSIS — Z794 Long term (current) use of insulin: Secondary | ICD-10-CM

## 2019-05-18 DIAGNOSIS — N183 Chronic kidney disease, stage 3 unspecified: Secondary | ICD-10-CM

## 2019-05-18 LAB — COMPREHENSIVE METABOLIC PANEL
ALT: 35 U/L (ref 0–44)
AST: 29 U/L (ref 15–41)
Albumin: 2.9 g/dL — ABNORMAL LOW (ref 3.5–5.0)
Alkaline Phosphatase: 88 U/L (ref 38–126)
Anion gap: 12 (ref 5–15)
BUN: 77 mg/dL — ABNORMAL HIGH (ref 8–23)
CO2: 20 mmol/L — ABNORMAL LOW (ref 22–32)
Calcium: 8.6 mg/dL — ABNORMAL LOW (ref 8.9–10.3)
Chloride: 112 mmol/L — ABNORMAL HIGH (ref 98–111)
Creatinine, Ser: 1.77 mg/dL — ABNORMAL HIGH (ref 0.61–1.24)
GFR calc Af Amer: 42 mL/min — ABNORMAL LOW (ref >60)
GFR calc non Af Amer: 36 mL/min — ABNORMAL LOW (ref >60)
Glucose, Bld: 153 mg/dL — ABNORMAL HIGH (ref 70–99)
Potassium: 3.8 mmol/L (ref 3.5–5.1)
Sodium: 144 mmol/L (ref 135–145)
Total Bilirubin: 0.8 mg/dL (ref 0.3–1.2)
Total Protein: 7.3 g/dL (ref 6.5–8.1)

## 2019-05-18 LAB — GLUCOSE, CAPILLARY
Glucose-Capillary: 54 mg/dL — ABNORMAL LOW (ref 70–99)
Glucose-Capillary: 350 mg/dL — ABNORMAL HIGH (ref 70–99)
Glucose-Capillary: 378 mg/dL — ABNORMAL HIGH (ref 70–99)
Glucose-Capillary: 386 mg/dL — ABNORMAL HIGH (ref 70–99)

## 2019-05-18 LAB — CBC WITH DIFFERENTIAL/PLATELET
Abs Immature Granulocytes: 1.44 10*3/uL — ABNORMAL HIGH (ref 0.00–0.07)
Basophils Absolute: 0.1 10*3/uL (ref 0.0–0.1)
Basophils Relative: 1 %
Eosinophils Absolute: 0 10*3/uL (ref 0.0–0.5)
Eosinophils Relative: 0 %
HCT: 40.5 % (ref 39.0–52.0)
Hemoglobin: 14 g/dL (ref 13.0–17.0)
Immature Granulocytes: 9 %
Lymphocytes Relative: 2 %
Lymphs Abs: 0.4 10*3/uL — ABNORMAL LOW (ref 0.7–4.0)
MCH: 28.5 pg (ref 26.0–34.0)
MCHC: 34.6 g/dL (ref 30.0–36.0)
MCV: 82.5 fL (ref 80.0–100.0)
Monocytes Absolute: 0.6 10*3/uL (ref 0.1–1.0)
Monocytes Relative: 4 %
Neutro Abs: 14.4 10*3/uL — ABNORMAL HIGH (ref 1.7–7.7)
Neutrophils Relative %: 84 %
Platelets: 424 10*3/uL — ABNORMAL HIGH (ref 150–400)
RBC: 4.91 MIL/uL (ref 4.22–5.81)
RDW: 14.1 % (ref 11.5–15.5)
WBC: 17 10*3/uL — ABNORMAL HIGH (ref 4.0–10.5)
nRBC: 0.4 % — ABNORMAL HIGH (ref 0.0–0.2)

## 2019-05-18 LAB — HEPARIN LEVEL (UNFRACTIONATED): Heparin Unfractionated: 0.42 IU/mL (ref 0.30–0.70)

## 2019-05-18 LAB — CULTURE, BLOOD (ROUTINE X 2)
Culture: NO GROWTH
Culture: NO GROWTH
Special Requests: ADEQUATE

## 2019-05-18 LAB — D-DIMER, QUANTITATIVE: D-Dimer, Quant: 3.53 ug/mL-FEU — ABNORMAL HIGH (ref 0.00–0.50)

## 2019-05-18 LAB — PROCALCITONIN: Procalcitonin: 0.55 ng/mL

## 2019-05-18 LAB — C-REACTIVE PROTEIN: CRP: 9.8 mg/dL — ABNORMAL HIGH (ref <1.0)

## 2019-05-18 MED ORDER — APIXABAN 5 MG PO TABS: 5.0000 mg | ORAL_TABLET | Freq: Two times a day (BID) | ORAL | Status: DC

## 2019-05-18 MED ORDER — APIXABAN 5 MG PO TABS
10.0000 mg | ORAL_TABLET | Freq: Two times a day (BID) | ORAL | Status: DC
  Administered 2019-05-18 – 2019-05-22 (×8): 10 mg via ORAL
  Filled 2019-05-18 (×9): qty 2

## 2019-05-18 MED ORDER — INSULIN DETEMIR 100 UNIT/ML ~~LOC~~ SOLN
35.0000 [IU] | Freq: Two times a day (BID) | SUBCUTANEOUS | Status: DC
  Administered 2019-05-18 – 2019-05-19 (×3): 35 [IU] via SUBCUTANEOUS
  Filled 2019-05-18 (×4): qty 0.35

## 2019-05-18 NOTE — Progress Notes (Signed)
PROGRESS NOTE    Ricky Brennan  [id number] DOB: [birth year] DOA: 05/13/2019 PCP: Tanda Rockers, MD    Brief Narrative:  79 year old male who presented with dyspnea.  He does have significant past medical history for hypertension, type 2 diabetes mellitus, and anterior mediastinal mass, pulmonary nodules and history of prostate cancer.  She reported dyspnea and dry cough, for 7 days, associated with fever and loss of sense of taste.  On his initial physical examination his blood pressure was 125/74, heart rate 96, temperature 98.3, respiratory rate 26, oxygen saturation 96% on supplemental 02 per [place].  His lungs had bibasilar R[place], more left than right, no wheezing, heart S1-S2 present rhythmic, abdomen protuberant, soft, no lower extremity edema.  SARS COVID-19 was positive. Chest radiograph had left upper lobe faint interstitial infiltrates.  Patient was admitted to the hospital with a working diagnosis of acute hypoxic respiratory failure due to SARS COVID-19 viral pneumonia  Patient received medical therapy with remdesivir, and dexamethasone.  He was treated with COVID-19 convalescent plasma, and 5-day course of antibiotic therapy with ceftriaxone and azithromycin for suspected bacterial pneumonia coinfection.  His lower extremity ultrasonography was positive for left deep vein thrombosis.  His hospitalization was complicated by DKA and acute kidney injury.  Assessment & Plan:   Principal Problem:   Pneumonia due to COVID-19 virus Active Problems:   Acute respiratory failure with hypoxia (HCC)   ARF (acute renal failure) (HCC)   CKD stage 3 due to type 2 diabetes mellitus (HCC)   Type 2 diabetes mellitus with stage 3 chronic kidney disease ([place])   Essential hypertension   DVT (deep venous thrombosis) ([employer])   Malnutrition ([place])   1.  Acute hypoxic respiratory failure due to SARS COVID-19 viral pneumonia. Sp #5/5 Remdesivir, COVID 19 convalescent plasma 01/23, ceftriaxone/  azithromycin #5.   RR: 18 to 20  Pulse oxymetry: 88 to 94%  Fi02: 3 to 4 L/ min per [place]  COVID-19 Labs  Recent Labs    05/16/19 0425 05/17/19 0903 05/18/19 0025  DDIMER 11.23* 7.25* 3.53*  CRP 9.2* 7.4* 9.8*    No results found for: SARSCOV2NAA  Inflammatory markers elevated today and patient with significant fatigue.   Will continue medical therapy with systemic corticosteroids, antitussive agents, bronchodilators and airway clearing techniques with flutter valve and incentive spirometry.   Out of bed to chair tid with meals, physical and occupational therapy.   2. Acute left lower extremity DVT, femoral, popliteal, posterior tibial. Patient tolerating well anticoagulation with heparin, renal function with serum cr at 1,77, with calculated GFR 36. Will transition to apixaban for anticoagulation.   3. AKI on CkD stage 3b, with non gap metabolic acidosis. Patient has responded well to IV fluids, renal function with serum cr down to 1.77 today with K at 3,8 and serum bicarbonate at 21. Clinically more euvolemic, will continue close follow up of renal function and electrolytes, hold on further IV fluids for now, avoid hypotension and nephrotoxic medications.   4. HTN with hypertensive cardiomyopathy/ LVH.  Blood pressure systolic [id number] to 0000000 mmHg, will continue close monitoring, holding on ace inh and diuretics for now.   5. Severe protein calorie malnutrition. Continue nutritional supplements.   6. Hx pulmonary nodules and anterior mediastinal mass. Follow as outpatient with [facility]. Possible thymic origin or lymphoma.   7. Uncontrolled T2DM (Hgb A1c 9,2) with steroid induced hyperglycemia. Fasting glucose this am is 153, capillary 54 and 350. Will continue insulin sliding scale for glucose cover  and monitoring, will decrease basal insulin from 45 units bid to 35 units bid. Meal coverage with 6 units aspart. Patient is tolerating po well.   DVT prophylaxis: enoxaparin   Code Status:   full Family Communication: no family at the bedside  Disposition Plan/ discharge barriers: pending improved mobility, possible dc in am.    Subjective: Patient continue to be very weak and deconditioned, dyspnea is improving but not yet back to baseline, no nausea or vomiting, no chest pain.   Objective: Vitals:   05/17/19 2012 05/18/19 0000 05/18/19 0400 05/18/19 0731  BP: (!) 151/83 (!) 148/72 137/74 (!) 162/88  Pulse: 90 89 72 95  Resp: 20 20 20 18   Temp: 97.8 F (36.6 C) 97.9 F (36.6 C) 98 F (36.7 C) (!) 97.3 F (36.3 C)  TempSrc:    Oral  SpO2: 93% 95% 94% (!) 88%  Weight:      Height:        Intake/Output Summary (Last 24 hours) at 05/18/2019 0833 Last data filed at 05/17/2019 1900 Gross per 24 hour  Intake --  Output 400 ml  Net -400 ml   Filed Weights   05/14/19 0000 05/14/19 0120  Weight: 99.1 kg 89.5 kg    Examination:   General: Not in pain, positive dyspnea at rest. Deconditioned.  Neurology: Awake and alert, non focal  E ENT: mild pallor, no icterus, oral mucosa moist Cardiovascular: No JVD. S1 nontender, no lower extremity edema rhythmic, no gallops, rubs, or murmurs. No lower extremity edema. Pulmonary: positive breath sounds bilaterally. Gastrointestinal. Abdomen with no organomegaly, non tender, no rebound or guarding Skin. No rashes Musculoskeletal: no joint deformities     Data Reviewed: I have personally reviewed following labs and imaging studies  CBC: Recent Labs  Lab 05/14/19 0329 05/15/19 0245 05/16/19 0425 05/17/19 0903 05/18/19 0025  WBC 12.4* 13.0* 13.8* 16.8* 17.0*  NEUTROABS 11.4* 12.0* 12.3* 14.3* 14.4*  HGB 12.3* 11.9* 12.8* 14.6 14.0  HCT 36.4* 35.1* 38.5* 44.2 40.5  MCV 83.7 82.6 83.5 84.4 82.5  PLT 349 378 437* 474* 123456*   Basic Metabolic Panel: Recent Labs  Lab 05/14/19 1830 05/15/19 0245 05/16/19 0425 05/17/19 0903 05/18/19 0025  NA 140 142 143 143 144  K 4.0 3.5 3.5 4.2 3.8  CL 107 108 108 110 112*   CO2 20* 21* 21* 19* 20*  GLUCOSE 218* 247* 237* 221* 153*  BUN 89* 93* 91* 77* 77*  CREATININE 3.00* 2.69* 2.12* 1.92* 1.77*  CALCIUM 8.9 8.7* 8.6* 8.6* 8.6*   GFR: Estimated Creatinine Clearance: 37.2 mL/min (A) (by C-G formula based on SCr of 1.77 mg/dL (H)). Liver Function Tests: Recent Labs  Lab 05/14/19 1830 05/15/19 0245 05/16/19 0425 05/17/19 0903 05/18/19 0025  AST 53* 43* 29 32 29  ALT 26 28 28  32 35  ALKPHOS 65 58 72 94 88  BILITOT 0.8 0.4 0.6 1.0 0.8  PROT 7.5 6.9 7.2 7.5 7.3  ALBUMIN 2.8* 2.5* 2.7* 3.1* 2.9*   No results for input(s): LIPASE, AMYLASE in the last 168 hours. No results for input(s): AMMONIA in the last 168 hours. Coagulation Profile: No results for input(s): INR, PROTIME in the last 168 hours. Cardiac Enzymes: Recent Labs  Lab 05/16/19 0425  CKTOTAL 551*   BNP (last 3 results) No results for input(s): PROBNP in the last 8760 hours. HbA1C: No results for input(s): HGBA1C in the last 72 hours. CBG: Recent Labs  Lab 05/17/19 0706 05/17/19 1300 05/17/19 1654 05/17/19 1927 05/18/19  0747  GLUCAP 205* 303* 331* 319* 54*   Lipid Profile: No results for input(s): CHOL, HDL, LDLCALC, TRIG, CHOLHDL, LDLDIRECT in the last 72 hours. Thyroid Function Tests: No results for input(s): TSH, T4TOTAL, FREET4, T3FREE, THYROIDAB in the last 72 hours. Anemia Panel: No results for input(s): VITAMINB12, FOLATE, FERRITIN, TIBC, IRON, RETICCTPCT in the last 72 hours.    Radiology Studies: I have reviewed all of the imaging during this hospital visit personally     Scheduled Meds: . azithromycin  500 mg Oral Daily  . dexamethasone (DECADRON) injection  4 mg Intravenous Q24H  . docusate sodium  100 mg Oral BID  . famotidine  20 mg Oral Daily  . feeding supplement (PRO-STAT SUGAR FREE 64)  30 mL Oral BID  . insulin aspart  0-15 Units Subcutaneous TID WC  . insulin aspart  0-5 Units Subcutaneous QHS  . insulin aspart  6 Units Subcutaneous TID WC  .  insulin detemir  45 Units Subcutaneous BID   Continuous Infusions: . cefTRIAXone (ROCEPHIN)  IV Stopped (05/17/19 2222)  . heparin 1,450 Units/hr (05/18/19 0446)     LOS: 5 days        Keylie Beavers Gerome Apley, MD

## 2019-05-18 NOTE — Progress Notes (Signed)
Physical Therapy Treatment Patient Details Name: Ricky Brennan MRN: [id number] DOB: Mar 12, 1941 Today's Date: 05/18/2019    History of Present Illness 79 y.o. male admitted on 05/13/19 for dyspnea.  Pt dx with acute respiratory failure with hypoxia due to COVID 19 PNA, DKA, acute renal failure.  Pt also found to have L LE DVT and suspicious of PE.  Pt on heparin drip.  Pt with significant PMH of HTN, DM, prostate CA, CKD III, h/o pulmonary nodules and anterior mediastinal mass (followed by [facility]).      PT Comments    Pt looked much worse on his feet than I anticipated after initial evaluation. He needed constant min assist with RW when upright with 3/4 DOE and decreased sats to mid 70s during and immediately after gait. I turned his O2 up to 4 L O2 [place] and he took >5 mins to recover to the upper 80s.  (+) belly breathing and accessory muscle use with cues to slow his breath and pursed lip breathing.  He lives alone and would not do well in his current state home alone.  PT will continue to follow acutely for safe mobility progression   Follow Up Recommendations  SNF     Equipment Recommendations  Rolling walker with 5" wheels;Other (comment)(home O2)    Recommendations for Other Services   NA     Precautions / Restrictions Precautions Precautions: Fall;Other (comment) Precaution Comments: monitor O2 he has significant DOE with mobility even though he looks good at rest    Mobility  Bed Mobility               General bed mobility comments: Pt was OOB in the recliner chair today, which was encouraging as he was resistent to it last time I saw him.   Transfers Overall transfer level: Needs assistance Equipment used: Rolling walker (2 wheeled) Transfers: Sit to/from Stand Sit to Stand: Min assist         General transfer comment: Min assist to stand up to RW, posterior preference requiring assist to right himself once standing.   Ambulation/Gait Ambulation/Gait assistance:  Min assist Gait Distance (Feet): 10 Feet Assistive device: Rolling walker (2 wheeled) Gait Pattern/deviations: Step-through pattern;Shuffle;Staggering left;Staggering right Gait velocity: decreased Gait velocity interpretation: <1.8 ft/sec, indicate of risk for recurrent falls General Gait Details: Pt with shuffling gait pattern, very dyspnic with gait (3/4) turned up to 4 L O2 [place] from 2 L O2 [place].  Pt desated the most once seated to mid 70s, taking > 5 mins to recover to the upper 80s, and eventually 90s on 4 L O2 [place].  Turned back down to 2 L O2 [place] at rest at end of session.         Balance Overall balance assessment: Needs assistance Sitting-balance support: Feet supported;No upper extremity supported Sitting balance-Leahy Scale: Good     Standing balance support: Bilateral upper extremity supported Standing balance-Leahy Scale: Poor Standing balance comment: needs external assist in standing from RW and therapist.                             Cognition Arousal/Alertness: Awake/alert Behavior During Therapy: WFL for tasks assessed/performed Overall Cognitive Status: Within Functional Limits for tasks assessed  Exercises Other Exercises Other Exercises: Incentive spirometer x 10. Pulling 571mL.  Other Exercises: Asked MD and RN re: getting a flutter valve for him as he is very compliant with his IS breathing exercise.         Pertinent Vitals/Pain Pain Assessment: Faces Faces Pain Scale: Hurts little more Pain Location: head Pain Descriptors / Indicators: Aching Pain Intervention(s): Limited activity within patient's tolerance;Monitored during session;Premedicated before session;Repositioned           PT Goals (current goals can now be found in the care plan section) Acute Rehab PT Goals Patient Stated Goal: to feel better Progress towards PT goals: Progressing toward goals    Frequency    Min  3X/week(kept at 3 times due to unsure d/c plan)      PT Plan Discharge plan needs to be updated       AM-PAC PT "6 Clicks" Mobility   Outcome Measure  Help needed turning from your back to your side while in a flat bed without using bedrails?: A Little Help needed moving from lying on your back to sitting on the side of a flat bed without using bedrails?: A Little Help needed moving to and from a bed to a chair (including a wheelchair)?: A Little Help needed standing up from a chair using your arms (e.g., wheelchair or bedside chair)?: A Little Help needed to walk in hospital room?: A Little Help needed climbing 3-5 steps with a railing? : A Lot 6 Click Score: 17    End of Session Equipment Utilized During Treatment: Oxygen Activity Tolerance: Patient limited by fatigue Patient left: in chair;with call bell/phone within reach;with chair alarm set   PT Visit Diagnosis: Muscle weakness (generalized) (M62.81);Difficulty in walking, not elsewhere classified (R26.2);Pain Pain - Right/Left: (head) Pain - part of body: (head)     Time: [id number] PT Time Calculation (min) (ACUTE ONLY): 21 min  Charges:  $Gait Training: 8-22 mins            Verdene Lennert, PT, DPT  Acute Rehabilitation [phone] pager #[phone] office  @ [facility]: [phone]            05/18/2019, 5:55 PM

## 2019-05-18 NOTE — Progress Notes (Signed)
ANTICOAGULATION CONSULT NOTE  Pharmacy Consult:  Heparin Indication:  Acute DVT  Assessment: 2 YOM c/o SOB and anorexia from Covid-19.  Heparin started for increased oxygen needs and elevated d-dimer.  Doppler revealed acute left  DVT and an age indeterminate DVT in the popliteal/tibial veins.    Heparin level now therapeutic 0.42 units/ml  Goal of Therapy:  Heparin level 0.3-0.7 units/ml Monitor platelets by anticoagulation protocol: Yes   Plan:   Continue heparin gtt at 1450 units/hr Daily heparin level and CBC  Thank you for allowing pharmacy to be part of this patients care team.  Excell Seltzer, PharmD Clinical Pharmacist 05/18/2019, 1:42 AM

## 2019-05-18 NOTE — Progress Notes (Signed)
Inpatient Diabetes Program Recommendations  AACE/ADA: New Consensus Statement on Inpatient Glycemic Control (2015)  Target Ranges:  Prepandial:   less than 140 mg/dL      Peak postprandial:   less than 180 mg/dL (1-2 hours)      Critically ill patients:  140 - 180 mg/dL   Lab Results  Component Value Date   GLUCAP 54 (L) 05/18/2019   HGBA1C 9.2 (H) 05/15/2019    Review of Glycemic Control Results for Ricky Brennan, Ricky Brennan (MRN [id number]) as of 05/18/2019 10:21  Ref. Range 05/17/2019 07:06 05/17/2019 13:00 05/17/2019 16:54 05/17/2019 19:27 05/18/2019 07:47  Glucose-Capillary Latest Ref Range: 70 - 99 mg/dL 205 (H) 303 (H) 331 (H) 319 (H) 54 (L)  Diabetes history: DM2 Outpatient Diabetes medications: NPH 40 units QAM if CBG >200 mg/dl, NPH 30 units QPM if CBG >200 mg/dl Current orders for Inpatient glycemic control: Levemir 45 units BID, Novolog 0-15 units TID with meals, Novolog 0-5 units QHS; Decadron 4 mg Q24H Inpatient Diabetes Program Recommendations:    Note low fasting CBG this AM.  Consider reducing PM dose of Levemir to 35 units. May need slight increase in meal coverage if post-prandial blood sugars continue to be elevated.   Thanks,  Adah Perl, RN, BC-ADM Inpatient Diabetes Coordinator Pager [phone] (8a-5p)

## 2019-05-18 NOTE — Progress Notes (Signed)
ANTICOAGULATION CONSULT NOTE - Follow Up Consult  Pharmacy Consult for Heparin >> Apixaban Indication: DVT  No Known Allergies  Patient Measurements: Height: 5\' 11"  (180.3 cm) Weight: 197 lb 5 oz (89.5 kg) IBW/kg (Calculated) : 75.3  Vital Signs: Temp: 97.5 F (36.4 C) (01/27 1126) Temp Source: Oral (01/27 1126) BP: 134/66 (01/27 1126) Pulse Rate: 95 (01/27 0731)  Labs: Recent Labs    05/16/19 0425 05/16/19 1250 05/17/19 0903 05/17/19 1545 05/18/19 0025  HGB 12.8*  --  14.6  --  14.0  HCT 38.5*  --  44.2  --  40.5  PLT 437*  --  474*  --  424*  HEPARINUNFRC 0.78*   < > 0.36 0.12* 0.42  CREATININE 2.12*  --  1.92*  --  1.77*  CKTOTAL 551*  --   --   --   --    < > = values in this interval not displayed.    Estimated Creatinine Clearance: 37.2 mL/min (A) (by C-G formula based on SCr of 1.77 mg/dL (H)).   Medications:  Scheduled:  . dexamethasone (DECADRON) injection  4 mg Intravenous Q24H  . docusate sodium  100 mg Oral BID  . famotidine  20 mg Oral Daily  . feeding supplement (PRO-STAT SUGAR FREE 64)  30 mL Oral BID  . insulin aspart  0-15 Units Subcutaneous TID WC  . insulin aspart  0-5 Units Subcutaneous QHS  . insulin aspart  6 Units Subcutaneous TID WC  . insulin detemir  35 Units Subcutaneous BID   Infusions:   PRN: acetaminophen, dextrose, polyethylene glycol  Assessment: 78 YOM c/o SOB and anorexia from Covid-19.  Heparin started for increased oxygen needs and elevated d-dimer.  Doppler revealed acute left  DVT and an age indeterminate DVT in the popliteal/tibial veins.  Pharmacy consulted to transition IV heparin to apixaban.  Heparin level therapeutic 0.42 this AM SCr 1.77, improved CBC stable/wnl No bleeding complications reported No drug-drug interactions noted  Goal of Therapy:  Therapeutic anticoagulation   Plan:   DC heparin  Begin apixaban 10mg  po BID x 7 days, then 5mg  po BID thereafter  Monitor CBC, signs/symptoms of  bleeding  Provide DOAC education prior to discharge  Peggyann Juba, PharmD, BCPS Pharmacy: [phone] 05/18/2019,2:57 PM

## 2019-05-19 ENCOUNTER — Inpatient Hospital Stay (HOSPITAL_COMMUNITY): Payer: Medicare HMO

## 2019-05-19 LAB — CBC WITH DIFFERENTIAL/PLATELET
Abs Immature Granulocytes: 1.67 10*3/uL — ABNORMAL HIGH (ref 0.00–0.07)
Basophils Absolute: 0.1 10*3/uL (ref 0.0–0.1)
Basophils Relative: 1 %
Eosinophils Absolute: 0 10*3/uL (ref 0.0–0.5)
Eosinophils Relative: 0 %
HCT: 43 % (ref 39.0–52.0)
Hemoglobin: 14.3 g/dL (ref 13.0–17.0)
Immature Granulocytes: 8 %
Lymphocytes Relative: 2 %
Lymphs Abs: 0.4 10*3/uL — ABNORMAL LOW (ref 0.7–4.0)
MCH: 27.8 pg (ref 26.0–34.0)
MCHC: 33.3 g/dL (ref 30.0–36.0)
MCV: 83.7 fL (ref 80.0–100.0)
Monocytes Absolute: 0.8 10*3/uL (ref 0.1–1.0)
Monocytes Relative: 4 %
Neutro Abs: 16.8 10*3/uL — ABNORMAL HIGH (ref 1.7–7.7)
Neutrophils Relative %: 85 %
Platelets: 401 10*3/uL — ABNORMAL HIGH (ref 150–400)
RBC: 5.14 MIL/uL (ref 4.22–5.81)
RDW: 14.6 % (ref 11.5–15.5)
WBC: 19.9 10*3/uL — ABNORMAL HIGH (ref 4.0–10.5)
nRBC: 0.6 % — ABNORMAL HIGH (ref 0.0–0.2)

## 2019-05-19 LAB — COMPREHENSIVE METABOLIC PANEL
ALT: 44 U/L (ref 0–44)
AST: 31 U/L (ref 15–41)
Albumin: 2.9 g/dL — ABNORMAL LOW (ref 3.5–5.0)
Alkaline Phosphatase: 88 U/L (ref 38–126)
Anion gap: 15 (ref 5–15)
BUN: 91 mg/dL — ABNORMAL HIGH (ref 8–23)
CO2: 19 mmol/L — ABNORMAL LOW (ref 22–32)
Calcium: 8.5 mg/dL — ABNORMAL LOW (ref 8.9–10.3)
Chloride: 106 mmol/L (ref 98–111)
Creatinine, Ser: 2.19 mg/dL — ABNORMAL HIGH (ref 0.61–1.24)
GFR calc Af Amer: 32 mL/min — ABNORMAL LOW (ref >60)
GFR calc non Af Amer: 28 mL/min — ABNORMAL LOW (ref >60)
Glucose, Bld: 278 mg/dL — ABNORMAL HIGH (ref 70–99)
Potassium: 4.1 mmol/L (ref 3.5–5.1)
Sodium: 140 mmol/L (ref 135–145)
Total Bilirubin: 0.6 mg/dL (ref 0.3–1.2)
Total Protein: 7.3 g/dL (ref 6.5–8.1)

## 2019-05-19 LAB — GLUCOSE, CAPILLARY
Glucose-Capillary: 224 mg/dL — ABNORMAL HIGH (ref 70–99)
Glucose-Capillary: 185 mg/dL — ABNORMAL HIGH (ref 70–99)
Glucose-Capillary: 153 mg/dL — ABNORMAL HIGH (ref 70–99)
Glucose-Capillary: 207 mg/dL — ABNORMAL HIGH (ref 70–99)

## 2019-05-19 LAB — D-DIMER, QUANTITATIVE: D-Dimer, Quant: 6.96 ug/mL-FEU — ABNORMAL HIGH (ref 0.00–0.50)

## 2019-05-19 LAB — C-REACTIVE PROTEIN: CRP: 8.8 mg/dL — ABNORMAL HIGH (ref <1.0)

## 2019-05-19 NOTE — TOC Initial Note (Addendum)
Transition of Care [facility]) - Initial/Assessment Note    Patient Details  Name: Ricky Brennan MRN: [id number] Date of Birth: 11-04-1940  [facility] CM/SW Contact:    Shade Flood, LCSW Phone Number: 05/19/2019, 12:24 PM  Clinical Narrative:                  Pt from home. He lives alone. PT recommending SNF. Spoke with pt by phone. He states, "I don't feel good". Discussed SNF rehab options and pt agreeable. Will refer out and follow up once bed offers available.  Spoke with pt's son by phone to update as well. At son's request, also spoke with pt's DIL, Langley Gauss, by phone to update her. Her number is [phone].   Will follow.  1630: Updated family on bed offers and they request [facility]. Updated Gerald Stabs at [facility]. Gerald Stabs hoping they will have bed for pt tomorrow.  Expected Discharge Plan: Skilled Nursing Facility Barriers to Discharge: SNF Pending bed offer   Patient Goals and CMS Choice   CMS Medicare.gov Compare Post Acute Care list provided to:: Patient Choice offered to / list presented to : Patient  Expected Discharge Plan and Services Expected Discharge Plan: [place] In-house Referral: Clinical Social Work   Post Acute Care Choice: [place] Living arrangements for the past 2 months: [place]                                      Prior Living Arrangements/Services Living arrangements for the past 2 months: Single Family Home Lives with:: Self Patient language and need for interpreter reviewed:: Yes        Need for Family Participation in Patient Care: No (Comment) Care giver support system in place?: No (comment)   Criminal Activity/Legal Involvement Pertinent to Current Situation/Hospitalization: No - Comment as needed  Activities of Daily Living Home Assistive Devices/Equipment: None ADL Screening (condition at time of admission) Patient's cognitive ability adequate to safely  complete daily activities?: Yes Is the patient deaf or have difficulty hearing?: No Does the patient have difficulty seeing, even when wearing glasses/contacts?: No Does the patient have difficulty concentrating, remembering, or making decisions?: No Patient able to express need for assistance with ADLs?: No Does the patient have difficulty dressing or bathing?: No Independently performs ADLs?: Yes (appropriate for developmental age) Does the patient have difficulty walking or climbing stairs?: No Weakness of Legs: None Weakness of Arms/Hands: None  Permission Sought/Granted Permission sought to share information with : Chartered certified accountant granted to share information with : Yes, Verbal Permission Granted     Permission granted to share info w AGENCY: SNFs        Emotional Assessment   Attitude/Demeanor/Rapport: Lethargic Affect (typically observed): Flat Orientation: : Oriented to Self, Oriented to  Time, Oriented to Place, Oriented to Situation Alcohol / Substance Use: Not Applicable Psych Involvement: No (comment)  Admission diagnosis:  ARF (acute renal failure) ([place]) [N17.9] Acute respiratory failure with hypoxia ([place]) [J96.01] Diabetic ketoacidosis without coma associated with type 2 diabetes mellitus ([place]) [E11.10] COVID-19 [U07.1] Patient Active Problem List   Diagnosis Date Noted  . Pneumonia due to COVID-19 virus 05/18/2019  . CKD stage 3 due to type 2 diabetes mellitus ([place]) 05/18/2019  . Type 2 diabetes mellitus with stage 3 chronic kidney disease ([place]) 05/18/2019  . Essential hypertension 05/18/2019  . DVT (deep  venous thrombosis) ([place]) 05/18/2019  . Malnutrition ([place]) 05/18/2019  . COVID-19 virus infection 05/14/2019  . ARF (acute renal failure) ([place]) 05/14/2019  . Acute respiratory failure with hypoxia ([place]) 05/13/2019   PCP:  Tanda Rockers, MD Pharmacy:   [facility] [address] [place] [id number] Phone: [phone] Fax: [phone]     Social Determinants of Health (SDOH) Interventions    Readmission Risk Interventions No flowsheet data found.

## 2019-05-19 NOTE — Progress Notes (Signed)
Pt declined ambulation. States that he "doesn't feel well".

## 2019-05-19 NOTE — Progress Notes (Signed)
MD notified that I had to increase patient's O2 from 2 L to 6L to maintain saturation >88. He said he was going to put new orders in for rxray. I also told MD that patient stated he's feeling worse than he did yesterday.

## 2019-05-19 NOTE — NC FL2 (Signed)
[place] LEVEL OF CARE SCREENING TOOL     IDENTIFICATION  Patient Name: Ricky Brennan Birthdate: 1940/05/11 Sex: male Admission Date (Current Location): 05/13/2019  [facility] and [place] Number:  Herbalist and Address:  [facility], [place] [address] [place], [place] [address]      Provider Number: [id number]  Attending Physician Name and Address:  Tawni Millers  Relative Name and Phone Number:       Current Level of Care: Hospital Recommended Level of Care: [place] Prior Approval Number:    Date Approved/Denied:   PASRR Number: [id number]  Discharge Plan: SNF    Current Diagnoses: Patient Active Problem List   Diagnosis Date Noted  . Pneumonia due to COVID-19 virus 05/18/2019  . CKD stage 3 due to type 2 diabetes mellitus ([place]) 05/18/2019  . Type 2 diabetes mellitus with stage 3 chronic kidney disease ([place]) 05/18/2019  . Essential hypertension 05/18/2019  . DVT (deep venous thrombosis) ([place]) 05/18/2019  . Malnutrition ([place]) 05/18/2019  . COVID-19 virus infection 05/14/2019  . ARF (acute renal failure) ([place]) 05/14/2019  . Acute respiratory failure with hypoxia ([place]) 05/13/2019    Orientation RESPIRATION BLADDER Height & Weight     Self, Time, Situation, Place  O2(see dc summary) Continent Weight: 197 lb 5 oz (89.5 kg) Height:  5\' 11"  (180.3 cm)  BEHAVIORAL SYMPTOMS/MOOD NEUROLOGICAL BOWEL NUTRITION STATUS      Continent Diet(see dc summary)  AMBULATORY STATUS COMMUNICATION OF NEEDS Skin   Extensive Assist Verbally Normal                       Personal Care Assistance Level of Assistance  Bathing, Feeding, Dressing Bathing Assistance: Limited assistance Feeding assistance: Independent Dressing Assistance: Limited assistance     Functional Limitations Info  Sight, Hearing, Speech Sight Info: Adequate Hearing Info: Impaired Speech Info: Adequate    SPECIAL CARE FACTORS  FREQUENCY  PT (By licensed PT), OT (By licensed OT)     PT Frequency: 5 times weekly OT Frequency: 3 times weekly            Contractures Contractures Info: Not present    Additional Factors Info  Code Status, Allergies Code Status Info: Full Allergies Info: KNA           Current Medications (05/19/2019):  This is the current hospital active medication list Current Facility-Administered Medications  Medication Dose Route Frequency Provider Last Rate Last Admin  . acetaminophen (TYLENOL) tablet 650 mg  650 mg Oral Q6H PRN Jani Gravel, MD   650 mg at 05/17/19 0847  . apixaban (ELIQUIS) tablet 10 mg  10 mg Oral BID Emiliano Dyer, RPH   10 mg at 05/19/19 0945   Followed by  . [START ON 05/25/2019] apixaban (ELIQUIS) tablet 5 mg  5 mg Oral BID Emiliano Dyer, [facility]      . dexamethasone (DECADRON) injection 4 mg  4 mg Intravenous Q24H Bonnielee Haff, MD   4 mg at 05/19/19 1204  . dextrose 50 % solution 0-50 mL  0-50 mL Intravenous PRN Lockamy, Timothy, DO      . docusate sodium (COLACE) capsule 100 mg  100 mg Oral BID Bonnielee Haff, MD   100 mg at 05/19/19 0945  . famotidine (PEPCID) tablet 20 mg  20 mg Oral Daily Bonnielee Haff, MD   20 mg at 05/19/19 0945  . feeding supplement (PRO-STAT SUGAR FREE 64) liquid 30  mL  30 mL Oral BID Jani Gravel, MD   30 mL at 05/18/19 2039  . insulin aspart (novoLOG) injection 0-15 Units  0-15 Units Subcutaneous TID WC Bonnielee Haff, MD   3 Units at 05/19/19 1204  . insulin aspart (novoLOG) injection 0-5 Units  0-5 Units Subcutaneous QHS Bonnielee Haff, MD   5 Units at 05/18/19 2039  . insulin aspart (novoLOG) injection 6 Units  6 Units Subcutaneous TID WC Bonnielee Haff, MD   6 Units at 05/19/19 1204  . insulin detemir (LEVEMIR) injection 35 Units  35 Units Subcutaneous BID Tawni Millers, MD   35 Units at 05/19/19 0945  . polyethylene glycol (MIRALAX / GLYCOLAX) packet 17 g  17 g Oral Daily PRN Bonnielee Haff, MD   17 g at  05/19/19 0945     Discharge Medications: Please see discharge summary for a list of discharge medications.  Relevant Imaging Results:  Relevant Lab Results:   Additional Information    Shade Flood, LCSW

## 2019-05-19 NOTE — Progress Notes (Signed)
PROGRESS NOTE    Ricky Brennan  [id number] DOB: [birth year] DOA: 05/13/2019 PCP: Tanda Rockers, MD    Brief Narrative:  79 year old male who presented with dyspnea.  He does have significant past medical history for hypertension, type 2 diabetes mellitus, and anterior mediastinal mass, pulmonary nodules and history of prostate cancer.  She reported dyspnea and dry cough, for 7 days, associated with fever and loss of sense of taste.  On his initial physical examination his blood pressure was 125/74, heart rate 96, temperature 98.3, respiratory rate 26, oxygen saturation 96% on supplemental 02 per [place].  His lungs had bibasilar Rales, more left than right, no wheezing, heart S1-S2 present rhythmic, abdomen protuberant, soft, no lower extremity edema.  SARS COVID-19 was positive. Chest radiograph had left upper lobe faint interstitial infiltrates.  Patient was admitted to the hospital with a working diagnosis of acute hypoxic respiratory failure due to SARS COVID-19 viral pneumonia  Patient received medical therapy with remdesivir, and dexamethasone.  He was treated with COVID-19 convalescent plasma, and 5-day course of antibiotic therapy with ceftriaxone and azithromycin for suspected bacterial pneumonia coinfection.  His lower extremity ultrasonography was positive for left deep vein thrombosis.  His hospitalization was complicated by DKA and acute kidney injury.  Patient very weak and deconditioned, will need SNF at [place].    Assessment & Plan:   Principal Problem:   Pneumonia due to COVID-19 virus Active Problems:   Acute respiratory failure with hypoxia (HCC)   ARF (acute renal failure) (HCC)   CKD stage 3 due to type 2 diabetes mellitus (HCC)   Type 2 diabetes mellitus with stage 3 chronic kidney disease ([place])   Essential hypertension   DVT (deep venous thrombosis) ([place])   Malnutrition ([place])   1.  Acute hypoxic respiratory failure due to SARS COVID-19 viral  pneumonia. Sp #5/5 Remdesivir, COVID 19 convalescent plasma 01/23, ceftriaxone/ azithromycin #5  RR: 20 to 22  Pulse oxymetry: 92%  Fi02: 2 L/min per [facility]   COVID-19 Labs  Recent Labs    05/17/19 0903 05/18/19 0025 05/19/19 0445  DDIMER 7.25* 3.53* 6.96*  CRP 7.4* 9.8* 8.8*    No results found for: SARSCOV2NAA   Stable inflammatory markers, patient with persistent significant fatigue.   Tolerating well systemic corticosteroids #6, continue with antitussive agents, bronchodilators and airway clearing techniques with flutter valve and incentive spirometry.   Continue to encourage out of bed to chair tid with meals. Patient will need SNF at discharge.  2. Acute left lower extremity DVT, femoral, popliteal, posterior tibial. Transitioned to apixaban from heparin with good toleration, continue full anticoagulation.    3. AKI on CkD stage 3b, with non gap metabolic acidosis.  Stable renal function with serum cr at 2,19, K at 4,1 and serum bicarbonate at 19. Will continue to follow renal panel in 48 H. Avoid hypotension or nephrotoxic medications.   4. HTN with hypertensive cardiomyopathy/ LVH.  Stable blood pressure, patient off antihypertensive medications.  5. Severe protein calorie malnutrition. On nutritional supplements.   6. 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 Follow as outpatient at [facility].  7. Uncontrolled T2DM (Hgb A1c 9,2) with steroid induced hyperglycemia. Fasting glucose this am is 278 capillary 386, 224 and 185. On insulin sliding scale for glucose cover and monitoring, basal insulin 35 units bid, plus meal coverage with 6 units aspart.   DVT prophylaxis: enoxaparin   Code Status:  full Family Communication: no family at the  bedside  Disposition Plan/ discharge barriers: pending placement at SNF.    Subjective: Patient continue to be very weak and deconditioned, dyspnea has been improving but not feeling  back to baseline, no nausea or vomiting, this am out of bed to chair.   Objective: Vitals:   05/18/19 1950 05/19/19 0003 05/19/19 0429 05/19/19 0724  BP: 137/72 (!) 147/73 122/76 132/79  Pulse: 90 87 95   Resp: 20 20 20  (!) 22  Temp: (!) 97.4 F (36.3 C) 97.7 F (36.5 C) (!) 97.5 F (36.4 C) (!) 97.5 F (36.4 C)  TempSrc: Oral Oral Oral Oral  SpO2: 96% 92% 92%   Weight:      Height:        Intake/Output Summary (Last 24 hours) at 05/19/2019 0839 Last data filed at 05/18/2019 1700 Gross per 24 hour  Intake 1210.74 ml  Output 300 ml  Net 910.74 ml   Filed Weights   05/14/19 0000 05/14/19 0120  Weight: 99.1 kg 89.5 kg    Examination:   General: Not in pain, mild resting dyspnea, deconditioned. Neurology: Awake and alert, non focal  E ENT: mild pallor, no icterus, oral mucosa moist Cardiovascular: No JVD. S1-S2 present, rhythmic, no gallops, rubs, or murmurs. No lower extremity edema. Pulmonary: positive breath sounds bilaterally. Gastrointestinal. Abdomen with no organomegaly, non tender, no rebound or guarding Skin. No rashes Musculoskeletal: no joint deformities     Data Reviewed: I have personally reviewed following labs and imaging studies  CBC: Recent Labs  Lab 05/15/19 0245 05/16/19 0425 05/17/19 0903 05/18/19 0025 05/19/19 0445  WBC 13.0* 13.8* 16.8* 17.0* 19.9*  NEUTROABS 12.0* 12.3* 14.3* 14.4* 16.8*  HGB 11.9* 12.8* 14.6 14.0 14.3  HCT 35.1* 38.5* 44.2 40.5 43.0  MCV 82.6 83.5 84.4 82.5 83.7  PLT 378 437* 474* 424* [id number]*   Basic Metabolic Panel: Recent Labs  Lab 05/15/19 0245 05/16/19 0425 05/17/19 0903 05/18/19 0025 05/19/19 0445  NA 142 143 143 144 140  K 3.5 3.5 4.2 3.8 4.1  CL 108 108 110 112* 106  CO2 21* 21* 19* 20* 19*  GLUCOSE 247* 237* 221* 153* 278*  BUN 93* 91* 77* 77* 91*  CREATININE 2.69* 2.12* 1.92* 1.77* 2.19*  CALCIUM 8.7* 8.6* 8.6* 8.6* 8.5*   GFR: Estimated Creatinine Clearance: 30.1 mL/min (A) (by C-G formula based  on SCr of 2.19 mg/dL (H)). Liver Function Tests: Recent Labs  Lab 05/15/19 0245 05/16/19 0425 05/17/19 0903 05/18/19 0025 05/19/19 0445  AST 43* 29 32 29 31  ALT 28 28 32 35 44  ALKPHOS 58 72 94 88 88  BILITOT 0.4 0.6 1.0 0.8 0.6  PROT 6.9 7.2 7.5 7.3 7.3  ALBUMIN 2.5* 2.7* 3.1* 2.9* 2.9*   No results for input(s): LIPASE, AMYLASE in the last 168 hours. No results for input(s): AMMONIA in the last 168 hours. Coagulation Profile: No results for input(s): INR, PROTIME in the last 168 hours. Cardiac Enzymes: Recent Labs  Lab 05/16/19 0425  CKTOTAL 551*   BNP (last 3 results) No results for input(s): PROBNP in the last 8760 hours. HbA1C: No results for input(s): HGBA1C in the last 72 hours. CBG: Recent Labs  Lab 05/18/19 0747 05/18/19 1107 05/18/19 1551 05/18/19 2038 05/19/19 0728  GLUCAP 54* 350* 378* 386* 224*   Lipid Profile: No results for input(s): CHOL, HDL, LDLCALC, TRIG, CHOLHDL, LDLDIRECT in the last 72 hours. Thyroid Function Tests: No results for input(s): TSH, T4TOTAL, FREET4, T3FREE, THYROIDAB in the last 72 hours. Anemia  Panel: No results for input(s): VITAMINB12, FOLATE, FERRITIN, TIBC, IRON, RETICCTPCT in the last 72 hours.    Radiology Studies: I have reviewed all of the imaging during this hospital visit personally     Scheduled Meds: . apixaban  10 mg Oral BID   Followed by  . [START ON 05/25/2019] apixaban  5 mg Oral BID  . dexamethasone (DECADRON) injection  4 mg Intravenous Q24H  . docusate sodium  100 mg Oral BID  . famotidine  20 mg Oral Daily  . feeding supplement (PRO-STAT SUGAR FREE 64)  30 mL Oral BID  . insulin aspart  0-15 Units Subcutaneous TID WC  . insulin aspart  0-5 Units Subcutaneous QHS  . insulin aspart  6 Units Subcutaneous TID WC  . insulin detemir  35 Units Subcutaneous BID   Continuous Infusions:   LOS: 6 days        Thang Flett Gerome Apley, MD

## 2019-05-20 LAB — GLUCOSE, CAPILLARY
Glucose-Capillary: 66 mg/dL — ABNORMAL LOW (ref 70–99)
Glucose-Capillary: 77 mg/dL (ref 70–99)
Glucose-Capillary: 306 mg/dL — ABNORMAL HIGH (ref 70–99)
Glucose-Capillary: 308 mg/dL — ABNORMAL HIGH (ref 70–99)
Glucose-Capillary: 449 mg/dL — ABNORMAL HIGH (ref 70–99)
Glucose-Capillary: 529 mg/dL (ref 70–99)

## 2019-05-20 LAB — COMPREHENSIVE METABOLIC PANEL
ALT: 63 U/L — ABNORMAL HIGH (ref 0–44)
AST: 67 U/L — ABNORMAL HIGH (ref 15–41)
Albumin: 2.6 g/dL — ABNORMAL LOW (ref 3.5–5.0)
Alkaline Phosphatase: 107 U/L (ref 38–126)
Anion gap: 11 (ref 5–15)
BUN: 94 mg/dL — ABNORMAL HIGH (ref 8–23)
CO2: 21 mmol/L — ABNORMAL LOW (ref 22–32)
Calcium: 8.3 mg/dL — ABNORMAL LOW (ref 8.9–10.3)
Chloride: 111 mmol/L (ref 98–111)
Creatinine, Ser: 2.14 mg/dL — ABNORMAL HIGH (ref 0.61–1.24)
GFR calc Af Amer: 33 mL/min — ABNORMAL LOW (ref >60)
GFR calc non Af Amer: 29 mL/min — ABNORMAL LOW (ref >60)
Glucose, Bld: 201 mg/dL — ABNORMAL HIGH (ref 70–99)
Potassium: 4.3 mmol/L (ref 3.5–5.1)
Sodium: 143 mmol/L (ref 135–145)
Total Bilirubin: 0.9 mg/dL (ref 0.3–1.2)
Total Protein: 6.9 g/dL (ref 6.5–8.1)

## 2019-05-20 MED ORDER — INSULIN DETEMIR 100 UNIT/ML ~~LOC~~ SOLN: 15.0000 [IU] | Freq: Every day | SUBCUTANEOUS | Status: DC

## 2019-05-20 MED ORDER — FUROSEMIDE 10 MG/ML IJ SOLN
40.0000 mg | Freq: Once | INTRAMUSCULAR | Status: AC
  Administered 2019-05-20: 40 mg via INTRAVENOUS
  Filled 2019-05-20: qty 4

## 2019-05-20 MED ORDER — INSULIN ASPART 100 UNIT/ML ~~LOC~~ SOLN
5.0000 [IU] | Freq: Three times a day (TID) | SUBCUTANEOUS | Status: DC
  Administered 2019-05-20: 5 [IU] via SUBCUTANEOUS

## 2019-05-20 MED ORDER — INSULIN ASPART 100 UNIT/ML ~~LOC~~ SOLN
5.0000 [IU] | Freq: Once | SUBCUTANEOUS | Status: AC
  Administered 2019-05-20: 5 [IU] via SUBCUTANEOUS

## 2019-05-20 MED ORDER — INSULIN ASPART 100 UNIT/ML ~~LOC~~ SOLN
8.0000 [IU] | Freq: Once | SUBCUTANEOUS | Status: AC
  Administered 2019-05-20: 8 [IU] via SUBCUTANEOUS

## 2019-05-20 MED ORDER — SODIUM CHLORIDE 0.9 % IV BOLUS
250.0000 mL | Freq: Once | INTRAVENOUS | Status: AC
  Administered 2019-05-20: 250 mL via INTRAVENOUS

## 2019-05-20 NOTE — Discharge Instructions (Addendum)
 COVID-19 COVID-19 is a respiratory infection that is caused by a virus called severe acute respiratory syndrome coronavirus 2 (SARS-CoV-2). The disease is also known as coronavirus disease or novel coronavirus. In some people, the virus may not cause any symptoms. In others, it may cause a serious infection. The infection can get worse quickly and can lead to complications, such as:  Pneumonia, or infection of the lungs.  Acute respiratory distress syndrome or ARDS. This is a condition in which fluid build-up in the lungs prevents the lungs from filling with air and passing oxygen into the blood.  Acute respiratory failure. This is a condition in which there is not enough oxygen passing from the lungs to the body or when carbon dioxide is not passing from the lungs out of the body.  Sepsis or septic shock. This is a serious bodily reaction to an infection.  Blood clotting problems.  Secondary infections due to bacteria or fungus.  Organ failure. This is when your body's organs stop working. The virus that causes COVID-19 is contagious. This means that it can spread from person to person through droplets from coughs and sneezes (respiratory secretions). What are the causes? This illness is caused by a virus. You may catch the virus by:  Breathing in droplets from an infected person. Droplets can be spread by a person breathing, speaking, singing, coughing, or sneezing.  Touching something, like a table or a doorknob, that was exposed to the virus (contaminated) and then touching your mouth, nose, or eyes. What increases the risk? Risk for infection You are more likely to be infected with this virus if you:  Are within 6 feet (2 meters) of a person with COVID-19.  Provide care for or live with a person who is infected with COVID-19.  Spend time in crowded indoor spaces or live in shared housing. Risk for serious illness You are more likely to become seriously ill from the virus if  you:  Are 50 years of age or older. The higher your age, the more you are at risk for serious illness.  Live in a nursing home or long-term care facility.  Have cancer.  Have a long-term (chronic) disease such as: ? Chronic lung disease, including chronic obstructive pulmonary disease or asthma. ? A long-term disease that lowers your body's ability to fight infection (immunocompromised). ? Heart disease, including heart failure, a condition in which the arteries that lead to the heart become narrow or blocked (coronary artery disease), a disease which makes the heart muscle thick, weak, or stiff (cardiomyopathy). ? Diabetes. ? Chronic kidney disease. ? Sickle cell disease, a condition in which red blood cells have an abnormal "sickle" shape. ? Liver disease.  Are obese. What are the signs or symptoms? Symptoms of this condition can range from mild to severe. Symptoms may appear any time from 2 to 14 days after being exposed to the virus. They include:  A fever or chills.  A cough.  Difficulty breathing.  Headaches, body aches, or muscle aches.  Runny or stuffy (congested) nose.  A sore throat.  New loss of taste or smell. Some people may also have stomach problems, such as nausea, vomiting, or diarrhea. Other people may not have any symptoms of COVID-19. How is this diagnosed? This condition may be diagnosed based on:  Your signs and symptoms, especially if: ? You live in an area with a COVID-19 outbreak. ? You recently traveled to or from an area where the virus is common. ?   You provide care for or live with a person who was diagnosed with COVID-19. ? You were exposed to a person who was diagnosed with COVID-19.  A physical exam.  Lab tests, which may include: ? Taking a sample of fluid from the back of your nose and throat (nasopharyngeal fluid), your nose, or your throat using a swab. ? A sample of mucus from your lungs (sputum). ? Blood tests.  Imaging tests,  which may include, X-rays, CT scan, or ultrasound. How is this treated? At present, there is no medicine to treat COVID-19. Medicines that treat other diseases are being used on a trial basis to see if they are effective against COVID-19. Your health care provider will talk with you about ways to treat your symptoms. For most people, the infection is mild and can be managed at home with rest, fluids, and over-the-counter medicines. Treatment for a serious infection usually takes places in a hospital intensive care unit (ICU). It may include one or more of the following treatments. These treatments are given until your symptoms improve.  Receiving fluids and medicines through an IV.  Supplemental oxygen. Extra oxygen is given through a tube in the nose, a face mask, or a hood.  Positioning you to lie on your stomach (prone position). This makes it easier for oxygen to get into the lungs.  Continuous positive airway pressure (CPAP) or bi-level positive airway pressure (BPAP) machine. This treatment uses mild air pressure to keep the airways open. A tube that is connected to a motor delivers oxygen to the body.  Ventilator. This treatment moves air into and out of the lungs by using a tube that is placed in your windpipe.  Tracheostomy. This is a procedure to create a hole in the neck so that a breathing tube can be inserted.  Extracorporeal membrane oxygenation (ECMO). This procedure gives the lungs a chance to recover by taking over the functions of the heart and lungs. It supplies oxygen to the body and removes carbon dioxide. Follow these instructions at home: Lifestyle  If you are sick, stay home except to get medical care. Your health care provider will tell you how long to stay home. Call your health care provider before you go for medical care.  Rest at home as told by your health care provider.  Do not use any products that contain nicotine or tobacco, such as cigarettes,  e-cigarettes, and chewing tobacco. If you need help quitting, ask your health care provider.  Return to your normal activities as told by your health care provider. Ask your health care provider what activities are safe for you. General instructions  Take over-the-counter and prescription medicines only as told by your health care provider.  Drink enough fluid to keep your urine pale yellow.  Keep all follow-up visits as told by your health care provider. This is important. How is this prevented?  There is no vaccine to help prevent COVID-19 infection. However, there are steps you can take to protect yourself and others from this virus. To protect yourself:   Do not travel to areas where COVID-19 is a risk. The areas where COVID-19 is reported change often. To identify high-risk areas and travel restrictions, check the CDC travel website: wwwnc.cdc.gov/travel/notices  If you live in, or must travel to, an area where COVID-19 is a risk, take precautions to avoid infection. ? Stay away from people who are sick. ? Wash your hands often with soap and water for 20 seconds. If soap and   water are not available, use an alcohol-based hand sanitizer. ? Avoid touching your mouth, face, eyes, or nose. ? Avoid going out in public, follow guidance from your state and local health authorities. ? If you must go out in public, wear a cloth face covering or face mask. Make sure your mask covers your nose and mouth. ? Avoid crowded indoor spaces. Stay at least 6 feet (2 meters) away from others. ? Disinfect objects and surfaces that are frequently touched every day. This may include:  Counters and tables.  Doorknobs and light switches.  Sinks and faucets.  Electronics, such as phones, remote controls, keyboards, computers, and tablets. To protect others: If you have symptoms of COVID-19, take steps to prevent the virus from spreading to others.  If you think you have a COVID-19 infection, contact  your health care provider right away. Tell your health care team that you think you may have a COVID-19 infection.  Stay home. Leave your house only to seek medical care. Do not use public transport.  Do not travel while you are sick.  Wash your hands often with soap and water for 20 seconds. If soap and water are not available, use alcohol-based hand sanitizer.  Stay away from other members of your household. Let healthy household members care for children and pets, if possible. If you have to care for children or pets, wash your hands often and wear a mask. If possible, stay in your own room, separate from others. Use a different bathroom.  Make sure that all people in your household wash their hands well and often.  Cough or sneeze into a tissue or your sleeve or elbow. Do not cough or sneeze into your hand or into the air.  Wear a cloth face covering or face mask. Make sure your mask covers your nose and mouth. Where to find more information  Centers for Disease Control and Prevention: www.cdc.gov/coronavirus/2019-ncov/index.html  World Health Organization: www.who.int/health-topics/coronavirus Contact a health care provider if:  You live in or have traveled to an area where COVID-19 is a risk and you have symptoms of the infection.  You have had contact with someone who has COVID-19 and you have symptoms of the infection. Get help right away if:  You have trouble breathing.  You have pain or pressure in your chest.  You have confusion.  You have bluish lips and fingernails.  You have difficulty waking from sleep.  You have symptoms that get worse. These symptoms may represent a serious problem that is an emergency. Do not wait to see if the symptoms will go away. Get medical help right away. Call your local emergency services (911 in the [place] Do not drive yourself to the hospital. Let the emergency medical personnel know if you think you have  COVID-19. Summary  COVID-19 is a respiratory infection that is caused by a virus. It is also known as coronavirus disease or novel coronavirus. It can cause serious infections, such as pneumonia, acute respiratory distress syndrome, acute respiratory failure, or sepsis.  The virus that causes COVID-19 is contagious. This means that it can spread from person to person through droplets from breathing, speaking, singing, coughing, or sneezing.  You are more likely to develop a serious illness if you are 50 years of age or older, have a weak immune system, live in a nursing home, or have chronic disease.  There is no medicine to treat COVID-19. Your health care provider will talk with you about ways to treat your   symptoms.  Take steps to protect yourself and others from infection. Wash your hands often and disinfect objects and surfaces that are frequently touched every day. Stay away from people who are sick and wear a mask if you are sick. This information is not intended to replace advice given to you by your health care provider. Make sure you discuss any questions you have with your health care provider. Document Revised: 02/04/2019 Document Reviewed: 05/13/2018 Elsevier Patient Education  [place] on my medicine - ELIQUIS (apixaban)  This medication education was reviewed with me or my healthcare representative as part of my discharge preparation.   Why was Eliquis prescribed for you? Eliquis was prescribed to treat blood clots that may have been found in the veins of your legs (deep vein thrombosis) or in your lungs (pulmonary embolism) and to reduce the risk of them occurring again.  What do You need to know about Eliquis ? The starting dose is 10 mg (two 5 mg tablets) taken TWICE daily for the FIRST SEVEN (7) DAYS, then on (enter date)  05/25/19  the dose is reduced to ONE 5 mg tablet taken TWICE daily.  Eliquis may be taken with or without food.   Try to take  the dose about the same time in the morning and in the evening. If you have difficulty swallowing the tablet whole please discuss with your pharmacist how to take the medication safely.  Take Eliquis exactly as prescribed and DO NOT stop taking Eliquis without talking to the doctor who prescribed the medication.  Stopping may increase your risk of developing a new blood clot.  Refill your prescription before you run out.  After discharge, you should have regular check-up appointments with your healthcare provider that is prescribing your Eliquis.    What do you do if you miss a dose? If a dose of ELIQUIS is not taken at the scheduled time, take it as soon as possible on the same day and twice-daily administration should be resumed. The dose should not be doubled to make up for a missed dose.  Important Safety Information A possible side effect of Eliquis is bleeding. You should call your healthcare provider right away if you experience any of the following: ? Bleeding from an injury or your nose that does not stop. ? Unusual colored urine (red or dark brown) or unusual colored stools (red or black). ? Unusual bruising for unknown reasons. ? A serious fall or if you hit your head (even if there is no bleeding).  Some medicines may interact with Eliquis and might increase your risk of bleeding or clotting while on Eliquis. To help avoid this, consult your healthcare provider or pharmacist prior to using any new prescription or non-prescription medications, including herbals, vitamins, non-steroidal anti-inflammatory drugs (NSAIDs) and supplements.  This website has more information on Eliquis (apixaban): http://www.eliquis.com/eliquis/home

## 2019-05-20 NOTE — TOC Progression Note (Signed)
Transition of Care [facility]) - Progression Note    Patient Details  Name: Ricky Brennan MRN: [id number] Date of Birth: October 11, 1940  [facility] CM/SW [place], [place] Phone Number: 05/20/2019, 10:19 AM  Clinical Narrative:   CSW obtained bed at [facility] by patient, and asked about possible DC today. CSW alerted by MD that patient not stable today, hopeful for tomorrow. CSW confirmed with [facility] that bed would still be available tomorrow. CSW to follow.    Expected Discharge Plan: Skilled Nursing Facility Barriers to Discharge: SNF Pending bed offer  Expected Discharge Plan and Services Expected Discharge Plan: [place] In-house Referral: Clinical Social Work   Post Acute Care Choice: [place] Living arrangements for the past 2 months: Single Family Home                                       Social Determinants of Health (SDOH) Interventions    Readmission Risk Interventions No flowsheet data found.

## 2019-05-20 NOTE — Progress Notes (Signed)
Physical Therapy Treatment Patient Details Name: Ricky Brennan MRN: [id number] DOB: 20-Mar-1941 Today's Date: 05/20/2019    History of Present Illness 79 y.o. male admitted on 05/13/19 for dyspnea.  Pt dx with acute respiratory failure with hypoxia due to COVID 19 PNA, DKA, acute renal failure.  Pt also found to have L LE DVT and suspicious of PE.  Pt on heparin drip.  Pt with significant PMH of HTN, DM, prostate CA, CKD III, h/o pulmonary nodules and anterior mediastinal mass (followed by [facility]).      PT Comments    States he was exhausted. RN had helped him to transfer to chair and he was in [facility] chair when PT arrived. He states that he was exhausted and did not want to get up or walk again. Did agree to LE ex , breathing ex. Intermittent desats into 80's (84-87%), but recovered with pursed lip breathing and rest. Should benefit from PT to further address goals per primary PT and progress toward optimal functional outcomes.   Follow Up Recommendations  SNF     Equipment Recommendations  Rolling walker with 5" wheels;Other (comment)    Recommendations for Other Services       Precautions / Restrictions Precautions Precautions: Fall;Other (comment) Precaution Comments: monitor O2 he has significant DOE with mobility even though he looks good at rest Restrictions Weight Bearing Restrictions: No    Mobility  Bed Mobility                  Transfers                    Ambulation/Gait                 Stairs             Wheelchair Mobility    Modified Rankin (Stroke Patients Only)       Balance   Sitting-balance support: Feet supported;No upper extremity supported Sitting balance-Leahy Scale: Good     Standing balance support: Bilateral upper extremity supported                                Cognition Arousal/Alertness: Awake/alert Behavior During Therapy: WFL for tasks assessed/performed Overall Cognitive Status: Within  Functional Limits for tasks assessed                                 General Comments: He was in [facility] chair per RN when PT arrived. Patient explained that he was "too tired" to get up or walk, but would do exercises.      Exercises General Exercises - Lower Extremity Ankle Circles/Pumps: AROM;Seated;Both;10 reps Quad Sets: AROM;Seated;10 reps Short Arc Quad: AROM;Seated;10 reps Hip ABduction/ADduction: AROM;Seated;10 reps Hip Flexion/Marching: AROM;Seated;10 reps Other Exercises Other Exercises: Incentive spirometer x 10. Pulling 650- Did require rest in between LE ex and breathing ex. Other Exercises: Practiced pursed lip breathing , and discussed the importance of same.    General Comments        Pertinent Vitals/Pain Pain Assessment: No/denies pain    Home Living                      Prior Function            PT Goals (current goals can now be found in the care plan section) Acute Rehab PT  Goals Patient Stated Goal: to feel better PT Goal Formulation: With patient Time For Goal Achievement: 05/31/19 Potential to Achieve Goals: Good Progress towards PT goals: Progressing toward goals    Frequency    Min 3X/week      PT Plan Discharge plan needs to be updated    Co-evaluation              AM-PAC PT "6 Clicks" Mobility   Outcome Measure  Help needed turning from your back to your side while in a flat bed without using bedrails?: A Little(Per RN since nursing positioned in chair prior to PT) Help needed moving from lying on your back to sitting on the side of a flat bed without using bedrails?: A Little Help needed moving to and from a bed to a chair (including a wheelchair)?: A Little Help needed standing up from a chair using your arms (e.g., wheelchair or bedside chair)?: A Little Help needed to walk in hospital room?: A Little Help needed climbing 3-5 steps with a railing? : A Lot 6 Click Score: 17    End of Session Equipment  Utilized During Treatment: Oxygen Activity Tolerance: Patient limited by fatigue Patient left: in chair;with call bell/phone within reach;with chair alarm set   PT Visit Diagnosis: Muscle weakness (generalized) (M62.81);Difficulty in walking, not elsewhere classified (R26.2)     Time: [id number] PT Time Calculation (min) (ACUTE ONLY): 31 min  Charges:  $Therapeutic Exercise: 23-37 mins                    [place], PT # [phone] CGV cell   Casandra Doffing 05/20/2019, 6:19 PM

## 2019-05-20 NOTE — Progress Notes (Signed)
PROGRESS NOTE    Ricky Brennan  [id number] DOB: 28-Aug-1940 DOA: 05/13/2019 PCP: Tanda Rockers, MD    Brief Narrative:  79 year old male who presented with dyspnea. He does have significant past medical history for hypertension, type 2 diabetes mellitus, and anterior mediastinal mass, pulmonary nodules and history of prostate cancer. She reported dyspnea and dry cough, for 7 days, associated with fever and loss of sense of taste. On his initial physical examination his blood pressure was 125/74, heart rate 96, temperature 98.3, respiratory rate 26, oxygen saturation 96% on supplemental 02 per [place]. His lungs had bibasilar Rales, more left than right, no wheezing, heart S1-S2 present rhythmic, abdomen protuberant, soft, no lower extremity edema. SARS COVID-19 was positive. Chest radiograph had left upper lobe faint interstitial infiltrates.  Patient was admitted to the hospital with a working diagnosis of acute hypoxic respiratory failure due to SARS COVID-19 viral pneumonia  Patient received medical therapy with remdesivir, and dexamethasone. He was treated with COVID-19 convalescent plasma, and 5-day course of antibiotic therapy with ceftriaxone and azithromycin for suspected bacterial pneumonia coinfection.  His lower extremity ultrasonography was positive forleftdeep vein thrombosis. His hospitalization was complicated by DKA and acute kidney injury.  Patient very weak and deconditioned, will need SNF at discharge.  Follow up chest film 01/28 with positive signs of pulmonary edema, resume diuresis.    Assessment & Plan:   Principal Problem:   Pneumonia due to COVID-19 virus Active Problems:   Acute respiratory failure with hypoxia (HCC)   ARF (acute renal failure) (HCC)   CKD stage 3 due to type 2 diabetes mellitus (HCC)   Type 2 diabetes mellitus with stage 3 chronic kidney disease ([place])   Essential hypertension   DVT (deep venous thrombosis) ([place])  Malnutrition ([place])   1.Acute hypoxic respiratory failure due to SARS COVID-19 viral pneumonia. Sp #5/5 Remdesivir, COVID 19 convalescent plasma 01/23, ceftriaxone/ azithromycin #5  RR: 22 Pulse oxymetry: 92%  Fi02: 10 L/ min per HFNC  COVID-19 Labs  Recent Labs    05/17/19 0903 05/18/19 0025 05/19/19 0445  DDIMER 7.25* 3.53* 6.96*  CRP 7.4* 9.8* 8.8*    No results found for: [place]  Patient with worsening hypoxemia and increased oxygen requirements, follow up chest film personally reviewed with worsening bilateral interstitial infiltrates, will give 40 mg furosemide, for diuresis, follow on urine output.   Continue medical therapy with systemic corticosteroids #7, antitussive agents, bronchodilators and airway clearing techniques with flutter valve and incentive spirometry.   2. Acute left lower extremity DVT, femoral, popliteal, posterior tibial. Continue full anticoagulation with apixaban.   3. AKI on CkD stage 3b, with non gap metabolic acidosis.  Renal function with stable serum cr at 2,14, K at 4,3 and serum bicarbonate at 21. Will continue diuresis with furosemide, follow renal panel in am.  4. HTN with hypertensive cardiomyopathy/ LVH. Blood pressure 150 to 170 mmHg, will resume antihypertensive therapy with amlodipine, will hold on hctz and lisinopril for now.   5. Severe protein calorie malnutrition.  Continue with nutritional supplements.   6. 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 [facility] as outpatient at [facility].  7. Uncontrolled T2DM (Hgb A1c 9,2) with steroid induced hyperglycemia. Random glucose this am at 201,  capillary has been down to 66 and 77, will continue to reduce basal insulin to 15 units and will dc  pre-meal insulin to prevent hypoglycemia.   DVT prophylaxis:enoxaparin Code Status:full Family Communication:no family at the bedside  Disposition Plan/ discharge barriers:pending  placement at SNF, after diuresis.     Subjective: Overnight with worsening dyspnea and increased oxygen requirements, this am with persistent dyspnea, no cough or chest pain, no nausea or vomiting, continue to be very weak and deconditioned.   Objective: Vitals:   05/20/19 0100 05/20/19 0445 05/20/19 0505 05/20/19 0650  BP:  (!) 176/90 (!) 147/85   Pulse: 82 94 87 87  Resp:    (!) 22  Temp:      TempSrc:      SpO2: 95% (!) 81% (!) 88% 92%  Weight:      Height:        Intake/Output Summary (Last 24 hours) at 05/20/2019 0748 Last data filed at 05/20/2019 0400 Gross per 24 hour  Intake 480 ml  Output 850 ml  Net -370 ml   Filed Weights   05/14/19 0000 05/14/19 0120  Weight: 99.1 kg 89.5 kg    Examination:   General: deconditioned  Neurology: Awake and alert, non focal  E ENT: mild pallor, no icterus, oral mucosa moist Cardiovascular: No JVD. S1-S2 present, rhythmic, no gallops, rubs, or murmurs. + trace pitting lower extremity edema. Pulmonary: positive breath sounds bilaterally,  no wheezing, rhonchi or rales. Gastrointestinal. Abdomen with no organomegaly, non tender, no rebound or guarding Skin. No rashes Musculoskeletal: no joint deformities     Data Reviewed: I have personally reviewed following labs and imaging studies  CBC: Recent Labs  Lab 05/15/19 0245 05/16/19 0425 05/17/19 0903 05/18/19 0025 05/19/19 0445  WBC 13.0* 13.8* 16.8* 17.0* 19.9*  NEUTROABS 12.0* 12.3* 14.3* 14.4* 16.8*  HGB 11.9* 12.8* 14.6 14.0 14.3  HCT 35.1* 38.5* 44.2 40.5 43.0  MCV 82.6 83.5 84.4 82.5 83.7  PLT 378 437* 474* 424* [id number]*   Basic Metabolic Panel: Recent Labs  Lab 05/15/19 0245 05/16/19 0425 05/17/19 0903 05/18/19 0025 05/19/19 0445  NA 142 143 143 144 140  K 3.5 3.5 4.2 3.8 4.1  CL 108 108 110 112* 106  CO2 21* 21* 19* 20* 19*  GLUCOSE 247* 237* 221* 153* 278*  BUN 93* 91* 77* 77* 91*  CREATININE 2.69* 2.12* 1.92* 1.77* 2.19*  CALCIUM 8.7* 8.6* 8.6* 8.6*  8.5*   GFR: Estimated Creatinine Clearance: 30.1 mL/min (A) (by C-G formula based on SCr of 2.19 mg/dL (H)). Liver Function Tests: Recent Labs  Lab 05/15/19 0245 05/16/19 0425 05/17/19 0903 05/18/19 0025 05/19/19 0445  AST 43* 29 32 29 31  ALT 28 28 32 35 44  ALKPHOS 58 72 94 88 88  BILITOT 0.4 0.6 1.0 0.8 0.6  PROT 6.9 7.2 7.5 7.3 7.3  ALBUMIN 2.5* 2.7* 3.1* 2.9* 2.9*   No results for input(s): LIPASE, AMYLASE in the last 168 hours. No results for input(s): AMMONIA in the last 168 hours. Coagulation Profile: No results for input(s): INR, PROTIME in the last 168 hours. Cardiac Enzymes: Recent Labs  Lab 05/16/19 0425  CKTOTAL 551*   BNP (last 3 results) No results for input(s): PROBNP in the last 8760 hours. HbA1C: No results for input(s): HGBA1C in the last 72 hours. CBG: Recent Labs  Lab 05/18/19 2038 05/19/19 0728 05/19/19 1109 05/19/19 1548 05/19/19 2146  GLUCAP 386* 224* 185* 153* 207*   Lipid Profile: No results for input(s): CHOL, HDL, LDLCALC, TRIG, CHOLHDL, LDLDIRECT in the last 72 hours. Thyroid Function Tests: No results for input(s): TSH, T4TOTAL, FREET4, T3FREE, THYROIDAB in the last 72 hours. Anemia Panel: No results for input(s): VITAMINB12, FOLATE, FERRITIN, TIBC,  IRON, RETICCTPCT in the last 72 hours.    Radiology Studies: I have reviewed all of the imaging during this hospital visit personally     Scheduled Meds: . apixaban  10 mg Oral BID   Followed by  . [START ON 05/25/2019] apixaban  5 mg Oral BID  . dexamethasone (DECADRON) injection  4 mg Intravenous Q24H  . docusate sodium  100 mg Oral BID  . famotidine  20 mg Oral Daily  . feeding supplement (PRO-STAT SUGAR FREE 64)  30 mL Oral BID  . furosemide  40 mg Intravenous Once  . insulin aspart  0-15 Units Subcutaneous TID WC  . insulin aspart  0-5 Units Subcutaneous QHS  . insulin aspart  6 Units Subcutaneous TID WC  . insulin detemir  35 Units Subcutaneous BID   Continuous  Infusions:   LOS: 7 days        Gabby Rackers Gerome Apley, MD

## 2019-05-21 ENCOUNTER — Inpatient Hospital Stay (HOSPITAL_COMMUNITY): Payer: Medicare HMO

## 2019-05-21 LAB — BASIC METABOLIC PANEL
Anion gap: 14 (ref 5–15)
Anion gap: 15 (ref 5–15)
BUN: 100 mg/dL — ABNORMAL HIGH (ref 8–23)
BUN: 100 mg/dL — ABNORMAL HIGH (ref 8–23)
CO2: 21 mmol/L — ABNORMAL LOW (ref 22–32)
CO2: 18 mmol/L — ABNORMAL LOW (ref 22–32)
Calcium: 8.1 mg/dL — ABNORMAL LOW (ref 8.9–10.3)
Calcium: 8.6 mg/dL — ABNORMAL LOW (ref 8.9–10.3)
Chloride: 101 mmol/L (ref 98–111)
Chloride: 107 mmol/L (ref 98–111)
Creatinine, Ser: 2.32 mg/dL — ABNORMAL HIGH (ref 0.61–1.24)
Creatinine, Ser: 2.25 mg/dL — ABNORMAL HIGH (ref 0.61–1.24)
GFR calc Af Amer: 30 mL/min — ABNORMAL LOW (ref >60)
GFR calc Af Amer: 31 mL/min — ABNORMAL LOW (ref >60)
GFR calc non Af Amer: 26 mL/min — ABNORMAL LOW (ref >60)
GFR calc non Af Amer: 27 mL/min — ABNORMAL LOW (ref >60)
Glucose, Bld: 533 mg/dL (ref 70–99)
Glucose, Bld: 273 mg/dL — ABNORMAL HIGH (ref 70–99)
Potassium: 5.3 mmol/L — ABNORMAL HIGH (ref 3.5–5.1)
Potassium: 5.1 mmol/L (ref 3.5–5.1)
Sodium: 136 mmol/L (ref 135–145)
Sodium: 140 mmol/L (ref 135–145)

## 2019-05-21 LAB — GLUCOSE, CAPILLARY
Glucose-Capillary: 455 mg/dL — ABNORMAL HIGH (ref 70–99)
Glucose-Capillary: 480 mg/dL — ABNORMAL HIGH (ref 70–99)
Glucose-Capillary: 421 mg/dL — ABNORMAL HIGH (ref 70–99)
Glucose-Capillary: 200 mg/dL — ABNORMAL HIGH (ref 70–99)
Glucose-Capillary: 141 mg/dL — ABNORMAL HIGH (ref 70–99)
Glucose-Capillary: 113 mg/dL — ABNORMAL HIGH (ref 70–99)
Glucose-Capillary: 116 mg/dL — ABNORMAL HIGH (ref 70–99)
Glucose-Capillary: 96 mg/dL (ref 70–99)
Glucose-Capillary: 93 mg/dL (ref 70–99)
Glucose-Capillary: 108 mg/dL — ABNORMAL HIGH (ref 70–99)
Glucose-Capillary: 133 mg/dL — ABNORMAL HIGH (ref 70–99)
Glucose-Capillary: 167 mg/dL — ABNORMAL HIGH (ref 70–99)
Glucose-Capillary: 245 mg/dL — ABNORMAL HIGH (ref 70–99)

## 2019-05-21 MED ORDER — SODIUM CHLORIDE 0.9 % IV SOLN: INTRAVENOUS | Status: DC

## 2019-05-21 MED ORDER — INSULIN ASPART 100 UNIT/ML ~~LOC~~ SOLN
0.0000 [IU] | Freq: Three times a day (TID) | SUBCUTANEOUS | Status: DC
  Administered 2019-05-21: 3 [IU] via SUBCUTANEOUS
  Administered 2019-05-21: 2 [IU] via SUBCUTANEOUS
  Administered 2019-05-22: 15 [IU] via SUBCUTANEOUS
  Administered 2019-05-22: 5 [IU] via SUBCUTANEOUS

## 2019-05-21 MED ORDER — DEXTROSE-NACL 5-0.45 % IV SOLN: INTRAVENOUS | Status: DC

## 2019-05-21 MED ORDER — FUROSEMIDE 10 MG/ML IJ SOLN
40.0000 mg | Freq: Once | INTRAMUSCULAR | Status: AC
  Administered 2019-05-21: 40 mg via INTRAVENOUS
  Filled 2019-05-21: qty 4

## 2019-05-21 MED ORDER — DEXTROSE 50 % IV SOLN: 0.0000 mL | INTRAVENOUS | Status: DC | PRN

## 2019-05-21 MED ORDER — INSULIN REGULAR(HUMAN) IN NACL 100-0.9 UT/100ML-% IV SOLN
INTRAVENOUS | Status: DC
  Administered 2019-05-21: 17 [IU]/h via INTRAVENOUS
  Filled 2019-05-21 (×2): qty 100

## 2019-05-21 MED ORDER — INSULIN DETEMIR 100 UNIT/ML ~~LOC~~ SOLN
10.0000 [IU] | Freq: Every day | SUBCUTANEOUS | Status: DC
  Administered 2019-05-21 – 2019-05-22 (×2): 10 [IU] via SUBCUTANEOUS
  Filled 2019-05-21 (×2): qty 0.1

## 2019-05-21 NOTE — Progress Notes (Signed)
   05/21/19 1134  Family/Significant Other Communication  Family/Significant Other Update Called (No answer from [facility]. VM left with CB number)

## 2019-05-21 NOTE — Progress Notes (Signed)
   05/21/19 1140  Family/Significant Other Communication  Family/Significant Other Update Updated (Son [place] and DIL)

## 2019-05-21 NOTE — Progress Notes (Addendum)
PROGRESS NOTE    Ricky Brennan  [id number] DOB: 21-Mar-1941 DOA: 05/13/2019 PCP: Tanda Rockers, MD    Brief Narrative:  79 year old male who presented with dyspnea. He does have significant past medical history for hypertension, type 2 diabetes mellitus, and anterior mediastinal mass, pulmonary nodules and history of prostate cancer. She reported dyspnea and dry cough, for 7 days, associated with fever and loss of sense of taste. On his initial physical examination his blood pressure was 125/74, heart rate 96, temperature 98.3, respiratory rate 26, oxygen saturation 96% on supplemental 02 per [place]. His lungs had bibasilar Rales, more left than right, no wheezing, heart S1-S2 present rhythmic, abdomen protuberant, soft, no lower extremity edema. SARS COVID-19 was positive. Chest radiograph had left upper lobe faint interstitial infiltrates.  Patient was admitted to the hospital with a working diagnosis of acute hypoxic respiratory failure due to SARS COVID-19 viral pneumonia  Patient received medical therapy with remdesivir, and dexamethasone. He was treated with COVID-19 convalescent plasma, and 5-day course of antibiotic therapy with ceftriaxone and azithromycin for suspected bacterial pneumonia coinfection.  His lower extremity ultrasonography was positive forleftdeep vein thrombosis. His hospitalization was complicated by DKA and acute kidney injury.  Patient very weak and deconditioned, will need SNF at discharge.  Follow up chest film 01/28 with positive signs of pulmonary edema, resume diuresis.     Assessment & Plan:   Principal Problem:   Pneumonia due to COVID-19 virus Active Problems:   Acute respiratory failure with hypoxia (HCC)   ARF (acute renal failure) (HCC)   CKD stage 3 due to type 2 diabetes mellitus (HCC)   Type 2 diabetes mellitus with stage 3 chronic kidney disease ([place])   Essential hypertension   DVT (deep venous thrombosis) ([place])  Malnutrition ([place])   1.Acute hypoxic respiratory failure due to SARS COVID-19 viral pneumonia. Sp #5/5 Remdesivir, COVID 19 convalescent plasma 01/23, ceftriaxone/ azithromycin #5/ dexamethasone #6.   RR: 21 Pulse oxymetry: 93%  Fi02: 3 L/ min per HFNC  Patient responded well to diuresis with furosemide, urine output over lat 24 H, 1900 ml, follow up chest film personally reviewed with improvement in pulmonary edema but not yet back to baseline.   Will repeat dose of furosemide 40 mg IV, to target a negative fluid balance, continue with antitussive agents, bronchodilators and airway clearing techniques with flutter valve and incentive spirometry.   2. Acute left lower extremity DVT, femoral, popliteal, posterior tibial.Tolerating well full anticoagulation with apixaban.   3. AKI on CkD stage 3b, with non gap metabolic acidosis.Net fluid balance since admission is 2,273 ml. Stable renal function with stable serum cr at 2,25 with  K at 5,1 and serum bicarbonate at 18. Continue diuresis with furosemide.   4. HTN with hypertensive cardiomyopathy/ LVH. Blood pressure this am at 131/80 will continue withamlodipine for blood pressure control. Continue holding on hctz and lisinopril for now.   5. Severe protein calorie malnutrition.Onnutritional supplements.   6. Hx pulmonary nodules and anterior mediastinal mass. Possible thymic origin or lymphoma.Follow as outpatient at [facility].  7. Uncontrolled T2DM (Hgb A1c 9,2) with steroid induced hyperglycemia. patient with uncontrolled hyperglycemia, placed on insulin drip last night with improvement of glucose. This am will transition to sq insulin. Capillary glucose this am 96 and 93, will stop steroids and will resume basal insulin 10 units along with insulin sliding scale. Continue to calculate requirements.   DVT prophylaxis:enoxaparin Code Status:full Family Communication:I spoke over the phone with the patient's son about  patient's  condition, plan of care, prognosis and all questions were addressed. Disposition Plan/ discharge barriers:pending placement at SNF, after diuresis. Possible 05/22/19    Subjective: Patient feeling better, but continue to have dyspnea at reset, worse with exertion, no nausea or vomiting, no chest pain. Continue to be very weak and deconditioned.   Objective: Vitals:   05/21/19 0803 05/21/19 0859 05/21/19 0900 05/21/19 1103  BP:    131/80  Pulse:      Resp:    (!) 21  Temp:    (!) 97 F (36.1 C)  TempSrc:    Axillary  SpO2: 92% (!) 84% 91% 93%  Weight:      Height:        Intake/Output Summary (Last 24 hours) at 05/21/2019 1149 Last data filed at 05/21/2019 1058 Gross per 24 hour  Intake 2020.49 ml  Output 1425 ml  Net 595.49 ml   Filed Weights   05/14/19 0000 05/14/19 0120  Weight: 99.1 kg 89.5 kg    Examination:   General: Not in pain or dyspnea. Deconditioned  Neurology: Awake and alert, non focal  E ENT: mild pallor, no icterus, oral mucosa moist Cardiovascular: No JVD. S1-S2 present, rhythmic, no gallops, rubs, or murmurs. No lower extremity edema. Pulmonary: positive breath sounds bilaterally. Gastrointestinal. Abdomen with no organomegaly, non tender, no rebound or guarding Skin. No rashes Musculoskeletal: no joint deformities     Data Reviewed: I have personally reviewed following labs and imaging studies  CBC: Recent Labs  Lab 05/15/19 0245 05/16/19 0425 05/17/19 0903 05/18/19 0025 05/19/19 0445  WBC 13.0* 13.8* 16.8* 17.0* 19.9*  NEUTROABS 12.0* 12.3* 14.3* 14.4* 16.8*  HGB 11.9* 12.8* 14.6 14.0 14.3  HCT 35.1* 38.5* 44.2 40.5 43.0  MCV 82.6 83.5 84.4 82.5 83.7  PLT 378 437* 474* 424* [id number]*   Basic Metabolic Panel: Recent Labs  Lab 05/18/19 0025 05/19/19 0445 05/20/19 0959 05/20/19 2116 05/21/19 0255  NA 144 140 143 136 140  K 3.8 4.1 4.3 5.3* 5.1  CL 112* 106 111 101 107  CO2 20* 19* 21* 21* 18*  GLUCOSE 153* 278* 201*  533* 273*  BUN 77* 91* 94* 100* 100*  CREATININE 1.77* 2.19* 2.14* 2.32* 2.25*  CALCIUM 8.6* 8.5* 8.3* 8.1* 8.6*   GFR: Estimated Creatinine Clearance: 29.3 mL/min (A) (by C-G formula based on SCr of 2.25 mg/dL (H)). Liver Function Tests: Recent Labs  Lab 05/16/19 0425 05/17/19 0903 05/18/19 0025 05/19/19 0445 05/20/19 0959  AST 29 32 29 31 67*  ALT 28 32 35 44 63*  ALKPHOS 72 94 88 88 107  BILITOT 0.6 1.0 0.8 0.6 0.9  PROT 7.2 7.5 7.3 7.3 6.9  ALBUMIN 2.7* 3.1* 2.9* 2.9* 2.6*   No results for input(s): LIPASE, AMYLASE in the last 168 hours. No results for input(s): AMMONIA in the last 168 hours. Coagulation Profile: No results for input(s): INR, PROTIME in the last 168 hours. Cardiac Enzymes: Recent Labs  Lab 05/16/19 0425  CKTOTAL 551*   BNP (last 3 results) No results for input(s): PROBNP in the last 8760 hours. HbA1C: No results for input(s): HGBA1C in the last 72 hours. CBG: Recent Labs  Lab 05/21/19 0652 05/21/19 0736 05/21/19 0851 05/21/19 0951 05/21/19 1057  GLUCAP 113* 96 93 108* 133*   Lipid Profile: No results for input(s): CHOL, HDL, LDLCALC, TRIG, CHOLHDL, LDLDIRECT in the last 72 hours. Thyroid Function Tests: No results for input(s): TSH, T4TOTAL, FREET4, T3FREE, THYROIDAB in the last 72 hours. Anemia Panel:  No results for input(s): VITAMINB12, FOLATE, FERRITIN, TIBC, IRON, RETICCTPCT in the last 72 hours.    Radiology Studies: I have reviewed all of the imaging during this hospital visit personally     Scheduled Meds: . apixaban  10 mg Oral BID   Followed by  . [START ON 05/25/2019] apixaban  5 mg Oral BID  . docusate sodium  100 mg Oral BID  . famotidine  20 mg Oral Daily  . feeding supplement (PRO-STAT SUGAR FREE 64)  30 mL Oral BID  . insulin aspart  0-15 Units Subcutaneous TID WC  . insulin detemir  10 Units Subcutaneous Daily   Continuous Infusions:   LOS: 8 days        Tevita Gomer Gerome Apley, MD

## 2019-05-21 NOTE — Plan of Care (Signed)
  Problem: Nutritional: Goal: Maintenance of adequate nutrition will improve Outcome: Not Progressing   

## 2019-05-22 LAB — BASIC METABOLIC PANEL
Anion gap: 14 (ref 5–15)
BUN: 108 mg/dL — ABNORMAL HIGH (ref 8–23)
CO2: 19 mmol/L — ABNORMAL LOW (ref 22–32)
Calcium: 8.1 mg/dL — ABNORMAL LOW (ref 8.9–10.3)
Chloride: 106 mmol/L (ref 98–111)
Creatinine, Ser: 2.31 mg/dL — ABNORMAL HIGH (ref 0.61–1.24)
GFR calc Af Amer: 30 mL/min — ABNORMAL LOW (ref >60)
GFR calc non Af Amer: 26 mL/min — ABNORMAL LOW (ref >60)
Glucose, Bld: 216 mg/dL — ABNORMAL HIGH (ref 70–99)
Potassium: 4.9 mmol/L (ref 3.5–5.1)
Sodium: 139 mmol/L (ref 135–145)

## 2019-05-22 LAB — HEMOGLOBIN AND HEMATOCRIT, BLOOD
HCT: 41.8 % (ref 39.0–52.0)
Hemoglobin: 13.6 g/dL (ref 13.0–17.0)

## 2019-05-22 LAB — GLUCOSE, CAPILLARY: Glucose-Capillary: 229 mg/dL — ABNORMAL HIGH (ref 70–99)

## 2019-05-22 MED ORDER — POLYETHYLENE GLYCOL 3350 17 G PO PACK: 17.0000 g | PACK | Freq: Every day | ORAL | 0 refills | Status: AC | PRN

## 2019-05-22 MED ORDER — FAMOTIDINE 20 MG PO TABS: 20.0000 mg | ORAL_TABLET | Freq: Every day | ORAL | 0 refills | Status: AC

## 2019-05-22 MED ORDER — APIXABAN 5 MG PO TABS: ORAL_TABLET | ORAL | 0 refills | Status: AC

## 2019-05-22 NOTE — Progress Notes (Signed)
   05/22/19 1209  Patient Belongings/Medications Returned  Patient belongings from bedside/safe/pharmacy returned  Yes  Valuables returned to? [facility] [employer] valuables returned wallet with cash from security, clothing, shoes

## 2019-05-22 NOTE — TOC Transition Note (Signed)
Transition of Care [facility]) - CM/SW Discharge Note   Patient Details  Name: BARNABY FICK MRN: [id number] Date of Birth: 08/14/1940  [facility] CM/SW Contact:  Geralynn Ochs, LCSW Phone Number: 05/22/2019, 10:38 AM   Clinical Narrative:   Nurse to call report to [phone], ask for [facility] Nurse    Final next level of care: Skilled Nursing Facility Barriers to Discharge: Barriers Resolved   Patient Goals and CMS Choice   CMS Medicare.gov Compare Post Acute Care list provided to:: Patient Choice offered to / list presented to : Patient  Discharge Placement              Patient [facility] bed at: [facility] Patient to be transferred to facility by: [facility] Name of family member notified: Langley Gauss Patient and family notified of of transfer: 05/22/19  Discharge Plan and Services In-house Referral: Clinical Social Work   Post Acute Care Choice: [place]                               Social Determinants of Health (SDOH) Interventions     Readmission Risk Interventions No flowsheet data found.

## 2019-05-22 NOTE — Discharge Summary (Addendum)
Physician Discharge Summary  Ricky Brennan [id number] DOB: 10/24/1940 DOA: 05/13/2019  PCP: Tanda Rockers, MD  Admit date: 05/13/2019 Discharge date: 05/22/2019  Admitted From: Home  Disposition:  SNF   Recommendations for Outpatient Follow-up and new medication changes:  1. Follow up with Dr. Hiram Comber in 2 weeks.  2. Continue quarantine for 2 more weeks, maintain physical distancing and use a mask in public.  3. Apixaban 10 mg twice daily until February 2, then continue with 5 g twice daily starting February 3. 4. Follow up renal panel in 7 days.  5. Continue supplemental 02 per nasal cannula 2 to 4 L/ min to target 02 saturation more than 88%  Home Health: no   Equipment/Devices: na    Discharge Condition: stable  CODE STATUS: full  Diet recommendation: heart healthy and diabetic prudent.   Brief/Interim Summary: 79 year old male who presented with dyspnea. He does have significant past medical history for hypertension, type 2 diabetes mellitus, an anterior mediastinal mass, pulmonary nodules and history of prostate cancer. He reported dyspnea and dry cough for 7 days, associated with fever and loss of sense of taste. On his initial physical examination his blood pressure was 125/74, heart rate 96, temperature 98.3, respiratory rate 26, oxygen saturation 96% on supplemental 02 per [place]. His lungs had bibasilar rales, more left than right, no wheezing, heart S1-S2 present rhythmic, abdomen protuberant, soft, no lower extremity edema.  Sodium 126, potassium 4.5, chloride 92, bicarb 17, glucose 871, BUN 93, creatinine 4.0,white count 13.6, hemoglobin 12.6, hematocrit 38.5, platelets 379.  The specific gravity 1.016, negative for infection.  SARS COVID-19 was positive. Chest radiograph had left upper lobe faint interstitial infiltrates.  EKG 101 bpm, left axis deviation, normal intervals, sinus rhythm, no ST segment or T wave changes.  Patient was admitted to the hospital with a  working diagnosis of acute hypoxic respiratory failure due to SARS COVID-19 viral pneumonia  Patient received medical therapy with remdesivir, and dexamethasone. He was treated with COVID-19 convalescent plasma, and 5-day course of antibiotic therapy with ceftriaxone and azithromycin for suspected bacterial pneumonia coinfection.  His lower extremity ultrasonography was positive forleftdeep vein thrombosis. His hospitalization was complicated by DKA and acute kidney injury.  For non cardiogenic pulmonary edema he received furosemide with good response.   1. Acute hypoxic respiratory failure due to SARS COVID-19 viral pneumonia.  Patient was initially admitted to the progressive care unit, he received supplemental oxygen per high flow nasal cannula, medical therapy with remdesivir, dexamethasone, and COVID-19 convalescent plasma.  He was treated with antitussive agents, bronchodilators and airway clearaning techniques with incentive spirometer and flutter valve.  For bacterial pneumonia coinfection he received ceftriaxone and azithromycin for 5 days.  He was found to have noncardiogenic pulmonary edema and received diuresis with furosemide with good response.  His oxygen requirements decreased, his symptoms improved along with his inflammatory markers.  Patient was deemed stable for discharge on January 31.  Physical therapy recommended continue reconditioning at a skilled nursing facility.  At discharge continue oxygen supplementation to keep oxygen saturation more than 88%.   2.  Acute left lower extremity deep vein thrombosis, femoral, popliteal and posterior tibial.  Patient underwent ultrasonography lower extremities, he was found to have a new deep vein thrombosis in his left lower extremity.  He initially was anticoagulated with heparin and then transitioned to apixaban with good toleration.  3.  Acute kidney injury on chronic kidney disease stage IIIb with nongap metabolic acidosis.   Patient  had a close monitoring of his kidney function, he received supportive medical therapy with improvement of his kidney function, at discharge creatinine was 2.31, sodium 139, potassium 4.9, chloride 106.  Patient will need a repeat kidney function next week.  4.  Hypertension with hypertensive cardiomyopathy.  Blood pressure was controlled with amlodipine.  Further work-up with echocardiogram showed LV systolic function of 60 to 65%, with severely increased left ventricular hypertrophy.  Right ventricle with normal systolic function.  No significant valvular disease.  5.  Severe protein calorie malnutrition.  Patient was placed on nutritional supplements with good toleration.  6.  History of pulmonary nodules, and anterior mediastinal mass.  Possible thymic origin or lymphoma, follow-up as an outpatient with [facility].  7.  Uncontrolled type 2 diabetes mellitus, hemoglobin A1c 9.2, with steroid-induced hyperglycemia/DKA.  Patient received insulin therapy for glucose control, including insulin drip.  At discharge patient will resume his home regimen of insulin, corticosteroids have been discontinued.    Discharge Diagnoses:  Principal Problem:   Pneumonia due to COVID-19 virus Active Problems:   Acute respiratory failure with hypoxia (HCC)   ARF (acute renal failure) (HCC)   CKD stage 3 due to type 2 diabetes mellitus (HCC)   Type 2 diabetes mellitus with stage 3 chronic kidney disease ([place])   Essential hypertension   DVT (deep venous thrombosis) ([place])   Malnutrition ([place])    Discharge Instructions   Allergies as of 05/22/2019   No Known Allergies     Medication List    TAKE these medications   amLODipine 10 MG tablet Commonly known as: NORVASC Take 10 mg by mouth daily.   apixaban 5 MG Tabs tablet Commonly known as: ELIQUIS Take 2 tablets twice a day until 05/24/19, then continue taking 1 tablets twice daily starting 05/25/19.   enalapril 20 MG tablet Commonly known as:  VASOTEC Take 20 mg by mouth 2 (two) times daily.   famotidine 20 MG tablet Commonly known as: PEPCID Take 1 tablet (20 mg total) by mouth daily. Start taking on: May 23, 2019   guaifenesin 100 MG/5ML syrup Commonly known as: ROBITUSSIN Take 200 mg by mouth 3 (three) times daily as needed for cough.   hydrochlorothiazide 25 MG tablet Commonly known as: HYDRODIURIL Take 25 mg by mouth daily.   NovoLIN N 100 UNIT/ML injection Generic drug: insulin NPH Human Inject 30-40 Units into the skin See admin instructions. Inject 40 units subcutaneously in the morning if CBG >200, inject 30 units subcutaneously at night if CBG >200   polyethylene glycol 17 g packet Commonly known as: MIRALAX / GLYCOLAX Take 17 g by mouth daily as needed for moderate constipation.       Contact information for follow-up providers    Tanda Rockers, MD Follow up in 2 week(s).   Specialty: Internal Medicine Contact information: [address] [place] [id number] [facility] [address] [phone]            Contact information for after-discharge care    Destination    [facility] .   Service: Skilled Nursing Contact information: [address] [place] [phone]                 No Known Allergies  Consultations:     Procedures/Studies: DG Chest 1 View  Result Date: 05/21/2019 CLINICAL DATA:  Dyspnea.  COVID-19 positive. EXAM: CHEST  1 VIEW COMPARISON:  05/19/2019 FINDINGS: Lungs  are hypoinflated demonstrate patchy hazy bilateral airspace process without significant change likely multifocal pneumonia. No evidence of effusion. Cardiomediastinal silhouette and remainder of the exam is unchanged. IMPRESSION: Patchy bilateral hazy airspace process without significant change likely multifocal pneumonia. Electronically Signed   By: Marin Olp M.D.   On: 05/21/2019 10:21   DG Chest 1 View  Result  Date: 05/19/2019 CLINICAL DATA:  COVID-19, dyspnea EXAM: CHEST  1 VIEW COMPARISON:  Portable exam 1639 hours compared to 05/15/2019 FINDINGS: Normal heart size and mediastinal contours. BILATERAL pulmonary infiltrates consistent with multifocal pneumonia, slightly more confluent in the LEFT upper lobe. No pleural effusion or pneumothorax. Multilevel degenerative disc disease changes thoracic spine. IMPRESSION: BILATERAL pulmonary infiltrates consistent with multifocal pneumonia, slightly increased in the LEFT upper lobe. Electronically Signed   By: Lavonia Dana M.D.   On: 05/19/2019 16:48   US RENAL  Result Date: 05/14/2019 CLINICAL DATA:  COVID positive, hypertension, diabetes, acute renal failure. EXAM: RENAL / URINARY TRACT ULTRASOUND COMPLETE COMPARISON:  None. FINDINGS: Right Kidney: Renal measurements: 11 x 5.2 x 5.4 cm = volume: 163 mL . Echogenicity within normal limits. No mass or hydronephrosis visualized. Left Kidney: Renal measurements: 11.9 x 6.1 x 4.2 cm = volume: 158 mL. Echogenicity within normal limits. Two mildly complex cysts within the LEFT kidney, largest measuring 2.1 cm. No suspicious mass or hydronephrosis visualized. Bladder: Appears normal for degree of bladder distention. Other: None. IMPRESSION: 1. No acute findings.  No hydronephrosis. 2. LEFT renal cysts. Electronically Signed   By: Franki Cabot M.D.   On: 05/14/2019 10:21   DG Chest Port 1 View  Result Date: 05/15/2019 CLINICAL DATA:  Pneumonia due to COVID-19 virus. EXAM: PORTABLE CHEST 1 VIEW COMPARISON:  05/13/2019 and 04/26/2016 FINDINGS: Slightly increased interstitial densities in both lungs. Heart and mediastinum are within normal limits and stable. Trachea is midline. Negative for a pneumothorax. Bone structures are unremarkable. Densities and/or vascular crowding at the medial right lung base. IMPRESSION: Slightly increased interstitial lung densities bilaterally. Findings are compatible with pneumonia due to COVID-19.  Electronically Signed   By: Markus Daft M.D.   On: 05/15/2019 08:22   DG Chest Portable 1 View  Result Date: 05/13/2019 CLINICAL DATA:  Shortness of breath. EXAM: PORTABLE CHEST 1 VIEW COMPARISON:  April 26, 2016 FINDINGS: Mild diffusely increased interstitial lung markings are seen without evidence of acute infiltrate, pleural effusion or pneumothorax. The heart size and mediastinal contours are within normal limits. Degenerative changes seen throughout the thoracic spine. IMPRESSION: 1. Mildly increased interstitial lung markings which are likely chronic in nature. Electronically Signed   By: Virgina Norfolk M.D.   On: 05/13/2019 19:43   ECHOCARDIOGRAM COMPLETE  Result Date: 05/14/2019   ECHOCARDIOGRAM REPORT   Patient Name:   Ricky Brennan Date of Exam: 05/14/2019 Medical Rec #:  [id number]        Height:       71.0 in Accession #:    [id number]       Weight:       197.3 lb Date of Birth:  Apr 10, 1941        BSA:          2.10 m Patient Age:    76 years         BP:           121/77 mmHg Patient Gender: M                HR:  89 bpm. Exam Location:  Inpatient Procedure: 2D Echo Indications:    Dyspnea 786.09 / R06.00  History:        Patient has no prior history of Echocardiogram examinations.                 Risk Factors:Former Smoker, Hypertension and Diabetes. COVID-19.  Sonographer:    Clayton Lefort RDCS (AE) Referring Phys: [id number] [facility]  Sonographer Comments: Suboptimal subcostal window. Bright ambient room light. Ecocardiogram completed at [place]. COVID-19. IMPRESSIONS  1. Left ventricular ejection fraction, by visual estimation, is 60 to 65%. The left ventricle has normal function. There is severely increased left ventricular hypertrophy.  2. Elevated left atrial pressure.  3. Left ventricular diastolic parameters are consistent with Grade I diastolic dysfunction (impaired relaxation).  4. The left ventricle has no regional wall motion abnormalities.  5. Global right  ventricle has normal systolic function.The right ventricular size is normal.  6. Left atrial size was normal.  7. Right atrial size was normal.  8. The mitral valve is normal in structure. Trivial mitral valve regurgitation. No evidence of mitral stenosis.  9. The tricuspid valve is normal in structure. Tricuspid valve regurgitation is mild. 10. The aortic valve is tricuspid. Aortic valve regurgitation is mild. Mild aortic valve sclerosis without stenosis. 11. The pulmonic valve was normal in structure. Pulmonic valve regurgitation is not visualized. 12. Moderately elevated pulmonary artery systolic pressure. 13. The inferior vena cava is normal in size with greater than 50% respiratory variability, suggesting right atrial pressure of 3 mmHg. 14. Normal LV systolic function; grade 1 diastolic dysfunction; severe LVH; mild AI and TR; moderate pulmonary hypertension. FINDINGS  Left Ventricle: Left ventricular ejection fraction, by visual estimation, is 60 to 65%. The left ventricle has normal function. The left ventricle has no regional wall motion abnormalities. There is severely increased left ventricular hypertrophy. Left ventricular diastolic parameters are consistent with Grade I diastolic dysfunction (impaired relaxation). Elevated left atrial pressure. Right Ventricle: The right ventricular size is normal.Global RV systolic function is has normal systolic function. The tricuspid regurgitant velocity is 3.23 m/s, and with an assumed right atrial pressure of 3 mmHg, the estimated right ventricular systolic pressure is moderately elevated at 44.7 mmHg. Left Atrium: Left atrial size was normal in size. Right Atrium: Right atrial size was normal in size Pericardium: There is no evidence of pericardial effusion. Mitral Valve: The mitral valve is normal in structure. Trivial mitral valve regurgitation. No evidence of mitral valve stenosis by observation. Tricuspid Valve: The tricuspid valve is normal in structure.  Tricuspid valve regurgitation is mild. Aortic Valve: The aortic valve is tricuspid. Aortic valve regurgitation is mild. Aortic regurgitation PHT measures 590 msec. Mild aortic valve sclerosis is present, with no evidence of aortic valve stenosis. Aortic valve mean gradient measures 3.0 mmHg. Aortic valve peak gradient measures 4.4 mmHg. Aortic valve area, by VTI measures 2.56 cm. Pulmonic Valve: The pulmonic valve was normal in structure. Pulmonic valve regurgitation is not visualized. Pulmonic regurgitation is not visualized. Aorta: The aortic root is normal in size and structure. Venous: The inferior vena cava is normal in size with greater than 50% respiratory variability, suggesting right atrial pressure of 3 mmHg. IAS/Shunts: No atrial level shunt detected by color flow Doppler. Additional Comments: Normal LV systolic function; grade 1 diastolic dysfunction; severe LVH; mild AI and TR; moderate pulmonary hypertension.  LEFT VENTRICLE PLAX 2D LVIDd:         3.98 cm  Diastology  LVIDs:         2.86 cm  LV e' lateral:   5.77 cm/s LV PW:         1.58 cm  LV E/e' lateral: 10.5 LV IVS:        1.73 cm  LV e' medial:    3.92 cm/s LVOT diam:     2.00 cm  LV E/e' medial:  15.5 LV SV:         38 ml LV SV Index:   17.84 LVOT Area:     3.14 cm  RIGHT VENTRICLE             IVC RV Basal diam:  2.76 cm     IVC diam: 1.01 cm RV S prime:     11.50 cm/s TAPSE (M-mode): 1.9 cm LEFT ATRIUM             Index       RIGHT ATRIUM           Index LA diam:        2.70 cm 1.29 cm/m  RA Area:     12.30 cm LA Vol (A2C):   40.2 ml 19.18 ml/m RA Volume:   25.70 ml  12.26 ml/m LA Vol (A4C):   38.0 ml 18.13 ml/m LA Biplane Vol: 41.4 ml 19.75 ml/m  AORTIC VALVE AV Area (Vmax):    2.24 cm AV Area (Vmean):   2.09 cm AV Area (VTI):     2.56 cm AV Vmax:           105.00 cm/s AV Vmean:          80.200 cm/s AV VTI:            0.200 m AV Peak Grad:      4.4 mmHg AV Mean Grad:      3.0 mmHg LVOT Vmax:         74.80 cm/s LVOT Vmean:         53.400 cm/s LVOT VTI:          0.163 m LVOT/AV VTI ratio: 0.82 AI PHT:            590 msec  AORTA Ao Root diam: 3.40 cm MITRAL VALVE                        TRICUSPID VALVE MV Area (PHT): 3.77 cm             TR Peak grad:   41.7 mmHg MV PHT:        58.29 msec           TR Vmax:        323.00 cm/s MV Decel Time: 201 msec MV E velocity: 60.70 cm/s 103 cm/s  SHUNTS MV A velocity: 63.10 cm/s 70.3 cm/s Systemic VTI:  0.16 m MV E/A ratio:  0.96       1.5       Systemic Diam: 2.00 cm  Kirk Ruths MD Electronically signed by Kirk Ruths MD Signature Date/Time: 05/14/2019/3:54:04 PM    Final    VAS [place] LOWER EXTREMITY VENOUS (DVT)  Result Date: 05/16/2019  Lower Venous Study Indications: Edema, and Covid-19, elevated D-Dimer.  Comparison Study: Prior study from 12/14/15 is available for comparison. Performing Technologist: Sharion Dove RVS  Examination Guidelines: A complete evaluation includes B-mode imaging, spectral Doppler, color Doppler, and power Doppler as needed of all accessible portions of each vessel. Bilateral testing is considered an integral  part of a complete examination. Limited examinations for reoccurring indications may be performed as noted.  +---------+---------------+---------+-----------+----------+--------------+ RIGHT    CompressibilityPhasicitySpontaneityPropertiesThrombus Aging +---------+---------------+---------+-----------+----------+--------------+ CFV      Full           Yes      Yes                                 +---------+---------------+---------+-----------+----------+--------------+ SFJ      Full                                                        +---------+---------------+---------+-----------+----------+--------------+ FV Prox  Full                                                        +---------+---------------+---------+-----------+----------+--------------+ FV Mid   Full                                                         +---------+---------------+---------+-----------+----------+--------------+ FV DistalFull                                                        +---------+---------------+---------+-----------+----------+--------------+ PFV      Full                                                        +---------+---------------+---------+-----------+----------+--------------+ POP      Full           Yes      Yes                                 +---------+---------------+---------+-----------+----------+--------------+ PTV      Full                                                        +---------+---------------+---------+-----------+----------+--------------+ PERO     Full                                                        +---------+---------------+---------+-----------+----------+--------------+   +---------+---------------+---------+-----------+----------+-----------------+ LEFT     CompressibilityPhasicitySpontaneityPropertiesThrombus Aging    +---------+---------------+---------+-----------+----------+-----------------+ CFV      Full  Yes      Yes                                    +---------+---------------+---------+-----------+----------+-----------------+ SFJ      Full                                                           +---------+---------------+---------+-----------+----------+-----------------+ FV Prox  Full                                                           +---------+---------------+---------+-----------+----------+-----------------+ FV Mid   Partial                                      Chronic           +---------+---------------+---------+-----------+----------+-----------------+ FV DistalPartial                                      Chronic           +---------+---------------+---------+-----------+----------+-----------------+ PFV      Full                                                            +---------+---------------+---------+-----------+----------+-----------------+ POP      None           No       No                   Age Indeterminate +---------+---------------+---------+-----------+----------+-----------------+ PTV      None                                         Age Indeterminate +---------+---------------+---------+-----------+----------+-----------------+ PERO     Full                                                           +---------+---------------+---------+-----------+----------+-----------------+     Summary: Right: There is no evidence of deep vein thrombosis in the lower extremity. Left: Findings consistent with acute deep vein thrombosis involving the left femoral vein. Findings consistent with age indeterminate deep vein thrombosis involving the left popliteal vein, and left posterior tibial veins. Patient had acute DVT in the femoral, popliteal, and tibial veins in study done 12/14/15  *See table(s) above for measurements and observations. Electronically signed by Monica Martinez MD on 05/16/2019 at 4:47:34 PM.    Final       Procedures:  Subjective: Patient is feeling better, dyspnea has improved, continue to be very weak and deconditioned. No chest pain.   Discharge Exam: Vitals:   05/22/19 0400 05/22/19 0744  BP: (!) 149/72 130/72  Pulse: 93 87  Resp: (!) 29 20  Temp: 98.8 F (37.1 C) 98.3 F (36.8 C)  SpO2: 91% 94%   Vitals:   05/21/19 2005 05/21/19 2355 05/22/19 0400 05/22/19 0744  BP:  114/71 (!) 149/72 130/72  Pulse: 95 86 93 87  Resp: (!) 28 (!) 27 (!) 29 20  Temp:  98.7 F (37.1 C) 98.8 F (37.1 C) 98.3 F (36.8 C)  TempSrc:  Axillary Axillary Axillary  SpO2: 90% 97% 91% 94%  Weight:      Height:        General: Not in pain or dyspnea Neurology: Awake and alert, non focal  E ENT: no pallor, no icterus, oral mucosa moist Cardiovascular: No JVD. S1-S2 present, rhythmic, no gallops, rubs, or murmurs. No lower  extremity edema. Pulmonary: positive breath sounds bilaterally. Gastrointestinal. Abdomen wirh no organomegaly, non tender, no rebound or guarding Skin. No rashes Musculoskeletal: no joint deformities   The results of significant diagnostics from this hospitalization (including imaging, microbiology, ancillary and laboratory) are listed below for reference.     Microbiology: Recent Results (from the past 240 hour(s))  Blood Culture (routine x 2)     Status: None   Collection Time: 05/13/19  8:03 PM   Specimen: BLOOD LEFT ARM  Result Value Ref Range Status   Specimen Description BLOOD LEFT ARM  Final   Special Requests   Final    BOTTLES DRAWN AEROBIC AND ANAEROBIC Blood Culture adequate volume   Culture   Final    NO GROWTH 5 DAYS Performed at [facility], [address], [place], [place] [address]    Report Status 05/18/2019 FINAL  Final  Blood Culture (routine x 2)     Status: None   Collection Time: 05/13/19  8:06 PM   Specimen: BLOOD RIGHT ARM  Result Value Ref Range Status   Specimen Description BLOOD RIGHT ARM  Final   Special Requests   Final    BOTTLES DRAWN AEROBIC AND ANAEROBIC Blood Culture results may not be optimal due to an excessive volume of blood received in culture bottles   Culture   Final    NO GROWTH 5 DAYS Performed at [facility], [place] [address], [place], [place] [address]    Report Status 05/18/2019 FINAL  Final  MRSA PCR Screening     Status: None   Collection Time: 05/15/19  8:04 AM   Specimen: Nasopharyngeal  Result Value Ref Range Status   MRSA by PCR NEGATIVE NEGATIVE Final    Comment:        The GeneXpert MRSA Assay (FDA approved for NASAL specimens only), is one component of a comprehensive MRSA colonization surveillance program. It is not intended to diagnose MRSA infection nor to guide or monitor treatment for MRSA infections. Performed at [facility], [place] [address], [place], [place] [address]       Labs: BNP (last 3 results) Recent Labs    05/13/19 1941  BNP 0000000*   Basic Metabolic Panel: Recent Labs  Lab 05/19/19 0445 05/20/19 0959 05/20/19 2116 05/21/19 0255 05/22/19 0506  NA 140 143 136 140 139  K 4.1 4.3 5.3* 5.1 4.9  CL 106 111 101 107 106  CO2 19* 21* 21* 18* 19*  GLUCOSE 278*  201* 533* 273* 216*  BUN 91* 94* 100* 100* PENDING  CREATININE 2.19* 2.14* 2.32* 2.25* 2.31*  CALCIUM 8.5* 8.3* 8.1* 8.6* 8.1*   Liver Function Tests: Recent Labs  Lab 05/16/19 0425 05/17/19 0903 05/18/19 0025 05/19/19 0445 05/20/19 0959  AST 29 32 29 31 67*  ALT 28 32 35 44 63*  ALKPHOS 72 94 88 88 107  BILITOT 0.6 1.0 0.8 0.6 0.9  PROT 7.2 7.5 7.3 7.3 6.9  ALBUMIN 2.7* 3.1* 2.9* 2.9* 2.6*   No results for input(s): LIPASE, AMYLASE in the last 168 hours. No results for input(s): AMMONIA in the last 168 hours. CBC: Recent Labs  Lab 05/16/19 0425 05/17/19 0903 05/18/19 0025 05/19/19 0445  WBC 13.8* 16.8* 17.0* 19.9*  NEUTROABS 12.3* 14.3* 14.4* 16.8*  HGB 12.8* 14.6 14.0 14.3  HCT 38.5* 44.2 40.5 43.0  MCV 83.5 84.4 82.5 83.7  PLT 437* 474* 424* 401*   Cardiac Enzymes: Recent Labs  Lab 05/16/19 0425  CKTOTAL 551*   BNP: Invalid input(s): POCBNP CBG: Recent Labs  Lab 05/21/19 0951 05/21/19 1057 05/21/19 1605 05/21/19 2039 05/22/19 0743  GLUCAP 108* 133* 167* 245* 229*   D-Dimer No results for input(s): DDIMER in the last 72 hours. Hgb A1c No results for input(s): HGBA1C in the last 72 hours. Lipid Profile No results for input(s): CHOL, HDL, LDLCALC, TRIG, CHOLHDL, LDLDIRECT in the last 72 hours. Thyroid function studies No results for input(s): TSH, T4TOTAL, T3FREE, THYROIDAB in the last 72 hours.  Invalid input(s): FREET3 Anemia work up No results for input(s): VITAMINB12, FOLATE, FERRITIN, TIBC, IRON, RETICCTPCT in the last 72 hours. Urinalysis    Component Value Date/Time   COLORURINE YELLOW 05/14/2019 1142   APPEARANCEUR HAZY (A)  05/14/2019 1142   LABSPEC 1.016 05/14/2019 1142   PHURINE 5.0 05/14/2019 1142   GLUCOSEU 50 (A) 05/14/2019 1142   HGBUR LARGE (A) 05/14/2019 1142   BILIRUBINUR NEGATIVE 05/14/2019 1142   KETONESUR NEGATIVE 05/14/2019 1142   PROTEINUR 30 (A) 05/14/2019 1142   NITRITE NEGATIVE 05/14/2019 1142   LEUKOCYTESUR NEGATIVE 05/14/2019 1142   Sepsis Labs Invalid input(s): PROCALCITONIN,  WBC,  LACTICIDVEN Microbiology Recent Results (from the past 240 hour(s))  Blood Culture (routine x 2)     Status: None   Collection Time: 05/13/19  8:03 PM   Specimen: BLOOD LEFT ARM  Result Value Ref Range Status   Specimen Description BLOOD LEFT ARM  Final   Special Requests   Final    BOTTLES DRAWN AEROBIC AND ANAEROBIC Blood Culture adequate volume   Culture   Final    NO GROWTH 5 DAYS Performed at [facility], [place] [address], [place], [place] [address]    Report Status 05/18/2019 FINAL  Final  Blood Culture (routine x 2)     Status: None   Collection Time: 05/13/19  8:06 PM   Specimen: BLOOD RIGHT ARM  Result Value Ref Range Status   Specimen Description BLOOD RIGHT ARM  Final   Special Requests   Final    BOTTLES DRAWN AEROBIC AND ANAEROBIC Blood Culture results may not be optimal due to an excessive volume of blood received in culture bottles   Culture   Final    NO GROWTH 5 DAYS Performed at [facility], [place] [address] [place], [place] [address]    Report Status 05/18/2019 FINAL  Final  MRSA PCR Screening     Status: None   Collection Time: 05/15/19  8:04 AM  Specimen: Nasopharyngeal  Result Value Ref Range Status   MRSA by PCR NEGATIVE NEGATIVE Final    Comment:        The GeneXpert MRSA Assay (FDA approved for NASAL specimens only), is one component of a comprehensive MRSA colonization surveillance program. It is not intended to diagnose MRSA infection nor to guide or monitor treatment for MRSA infections. Performed at [facility], [place]  [address] [place], [place] [address]      Time coordinating discharge: 45 minutes  SIGNED:   Tawni Millers, MD  [facility] 05/22/2019, 8:54 AM

## 2019-05-22 NOTE — Progress Notes (Signed)
   05/22/19 1112  Family/Significant Other Communication  Family/Significant Other Update Called (No answer, son Garnet Koyanagi. VM left with CB number)

## 2019-05-23 LAB — GLUCOSE, CAPILLARY
Glucose-Capillary: 321 mg/dL — ABNORMAL HIGH (ref 70–99)
Glucose-Capillary: 373 mg/dL — ABNORMAL HIGH (ref 70–99)

## 2019-06-20 DEATH — deceased

## 2021-11-07 IMAGING — DX DG CHEST 1V PORT
1 series · 1 of 1 positions shown · non-contrast
Comparison: April 26, 2016

CLINICAL DATA: Shortness of breath.

EXAM:
PORTABLE CHEST 1 VIEW

[chest ap]
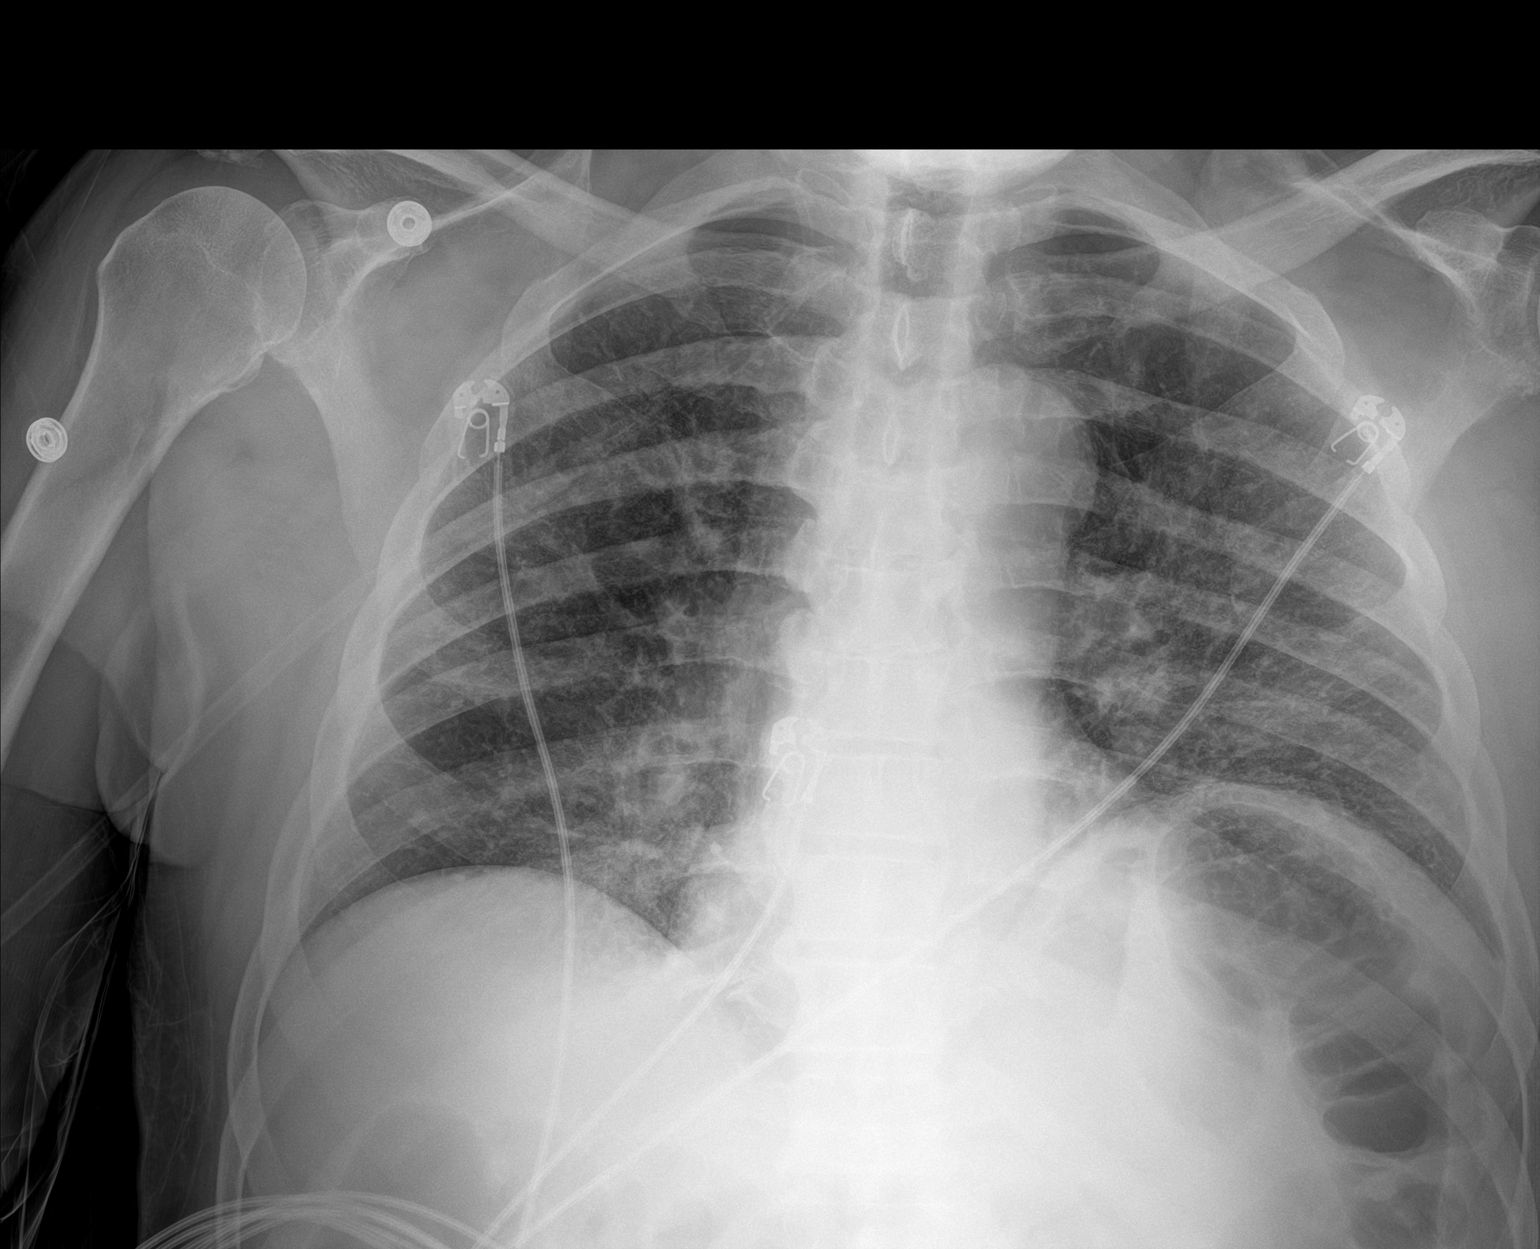

[1 of 1 positions shown; findings below may reference images not displayed]

FINDINGS: Mild diffusely increased interstitial lung markings are seen without
evidence of acute infiltrate, pleural effusion or pneumothorax. The
heart size and mediastinal contours are within normal limits.
Degenerative changes seen throughout the thoracic spine.
IMPRESSION: 1. Mildly increased interstitial lung markings which are likely
chronic in nature.

## 2021-11-09 IMAGING — DX DG CHEST 1V PORT
1 series · 1 of 1 positions shown · non-contrast
Comparison: 05/13/2019 and 04/26/2016

CLINICAL DATA: Pneumonia due to [id number] virus.

EXAM:
PORTABLE CHEST 1 VIEW

[chest]
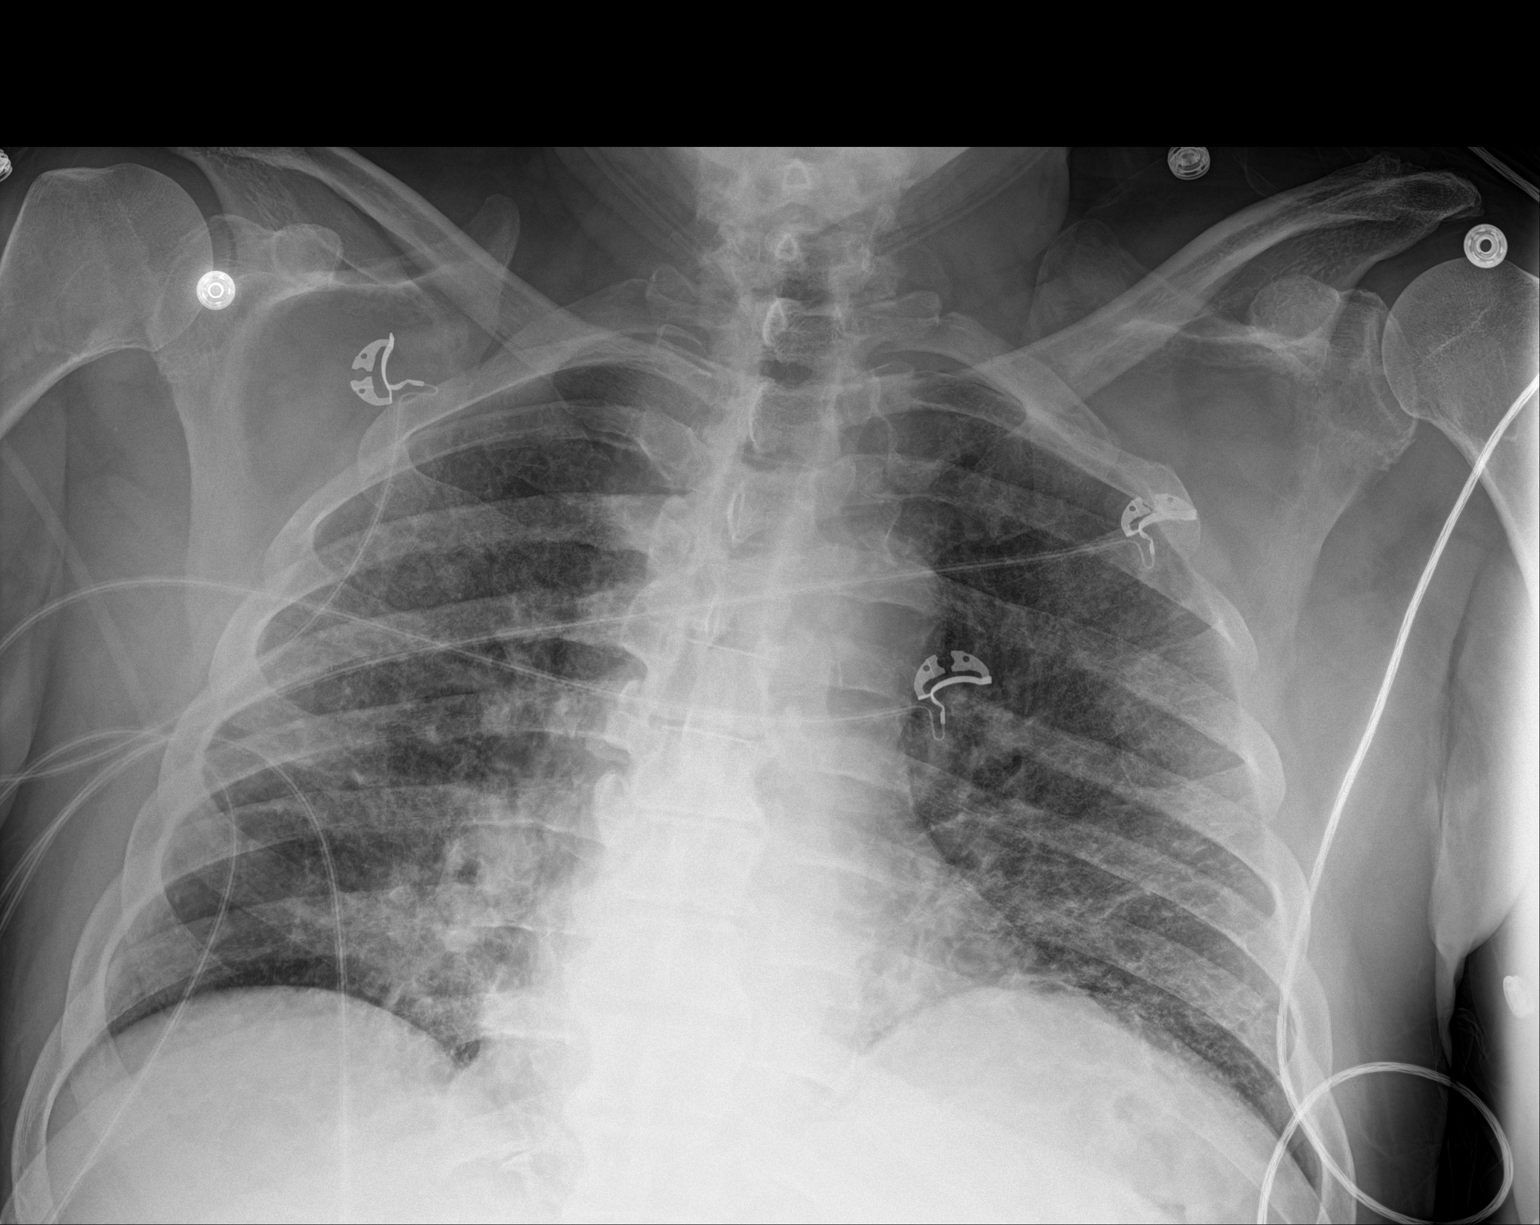

[1 of 1 positions shown; findings below may reference images not displayed]

FINDINGS: Slightly increased interstitial densities in both lungs. Heart and
mediastinum are within normal limits and stable. Trachea is midline.
Negative for a pneumothorax. Bone structures are unremarkable.
Densities and/or vascular crowding at the medial right lung base.
IMPRESSION: Slightly increased interstitial lung densities bilaterally. Findings
are compatible with pneumonia due to [id number].

## 2021-11-15 IMAGING — DX DG CHEST 1V
1 series · 1 of 1 positions shown · non-contrast
Comparison: 05/19/2019

CLINICAL DATA: Dyspnea.  [id number] positive.

EXAM:
CHEST  1 VIEW

[chest]
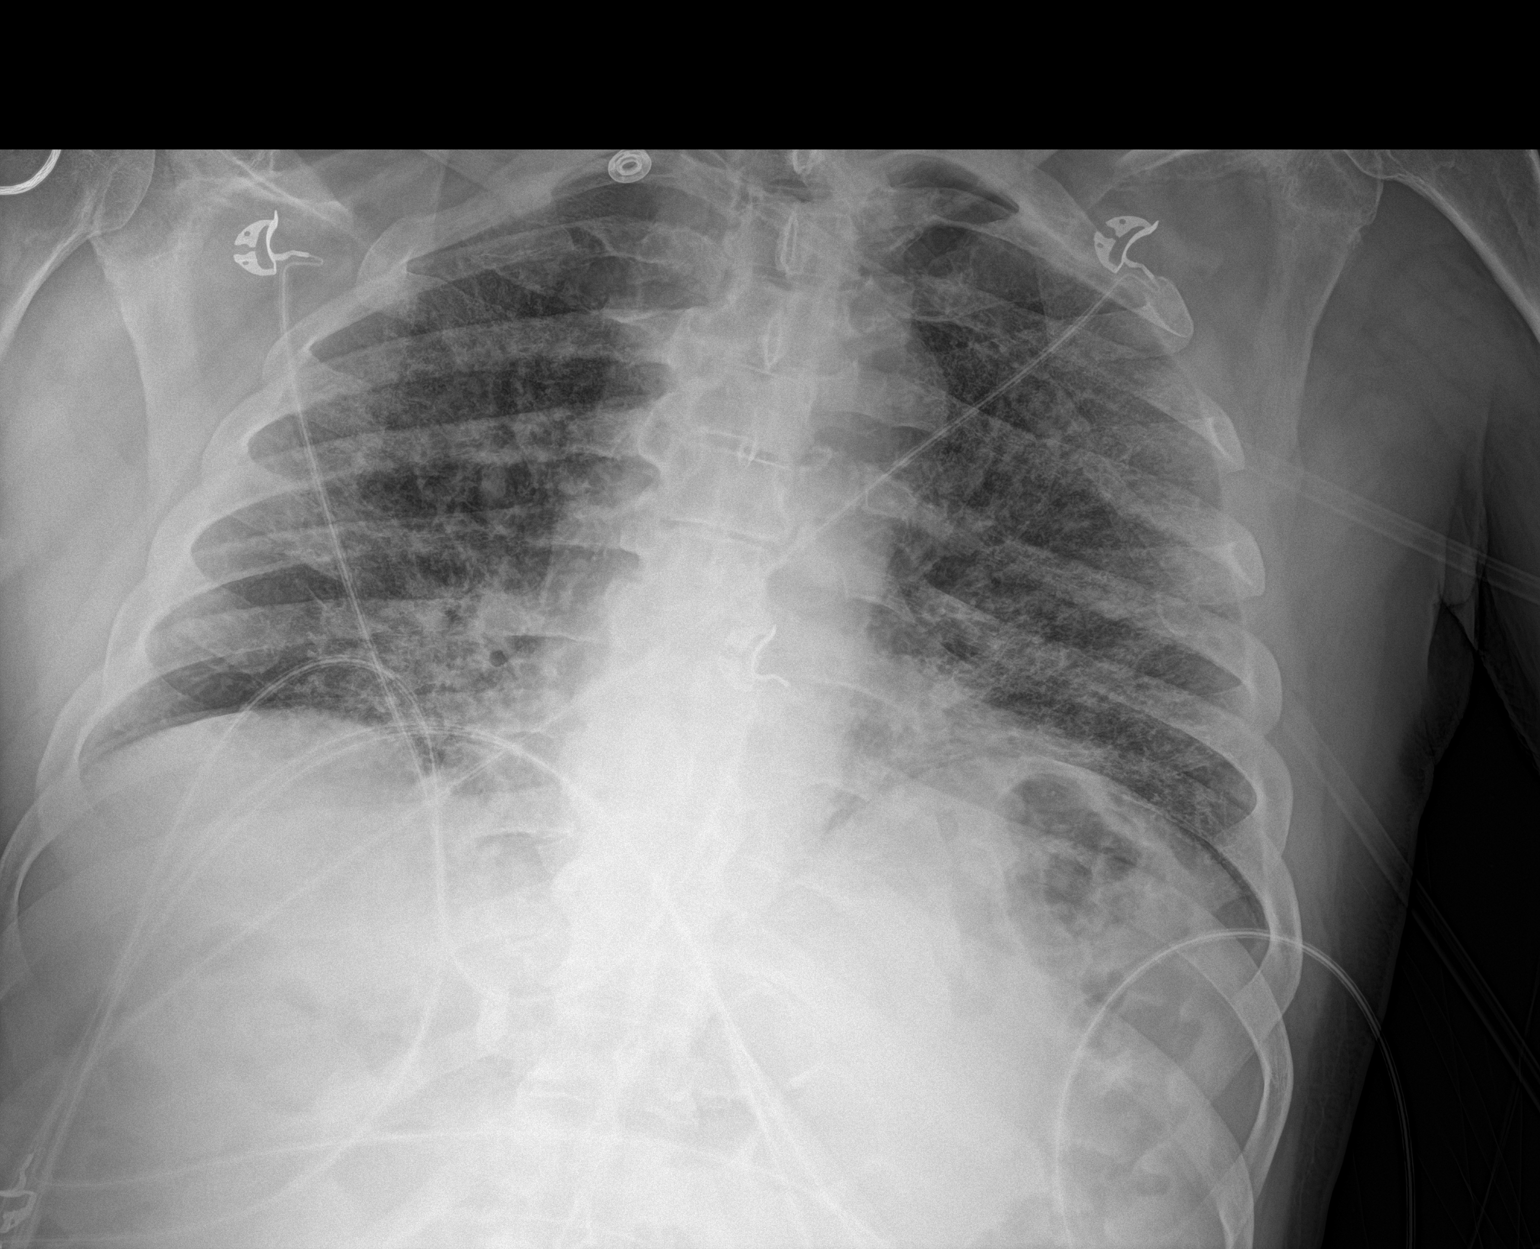

[1 of 1 positions shown; findings below may reference images not displayed]

FINDINGS: Lungs are hypoinflated demonstrate patchy hazy bilateral airspace
process without significant change likely multifocal pneumonia. No
evidence of effusion. Cardiomediastinal silhouette and remainder of
the exam is unchanged.
IMPRESSION: Patchy bilateral hazy airspace process without significant change
likely multifocal pneumonia.
